# Patient Record
Sex: Male | Born: 1980
Health system: Southern US, Community
[De-identification: ages and names within clinical notes are randomized; demographics above are authoritative.]

## PROBLEM LIST (undated history)

## (undated) DIAGNOSIS — E7800 Pure hypercholesterolemia, unspecified: Secondary | ICD-10-CM

## (undated) DIAGNOSIS — I10 Essential (primary) hypertension: Secondary | ICD-10-CM

## (undated) DIAGNOSIS — G510 Bell's palsy: Secondary | ICD-10-CM

## (undated) DIAGNOSIS — M109 Gout, unspecified: Secondary | ICD-10-CM

## (undated) DIAGNOSIS — E119 Type 2 diabetes mellitus without complications: Secondary | ICD-10-CM

## (undated) HISTORY — PX: KNEE SURGERY: SHX244

---

## 2007-10-02 ENCOUNTER — Emergency Department (HOSPITAL_COMMUNITY): Admission: EM | Admit: 2007-10-02 | Discharge: 2007-10-02 | Payer: Self-pay | Admitting: Emergency Medicine

## 2008-09-10 ENCOUNTER — Emergency Department (HOSPITAL_COMMUNITY): Admission: EM | Admit: 2008-09-10 | Discharge: 2008-09-10 | Payer: Self-pay | Admitting: Emergency Medicine

## 2009-03-07 ENCOUNTER — Emergency Department (HOSPITAL_COMMUNITY): Admission: EM | Admit: 2009-03-07 | Discharge: 2009-03-07 | Payer: Self-pay | Admitting: Emergency Medicine

## 2009-12-05 ENCOUNTER — Emergency Department (HOSPITAL_COMMUNITY): Admission: EM | Admit: 2009-12-05 | Discharge: 2009-12-05 | Payer: Self-pay | Admitting: Emergency Medicine

## 2010-06-05 ENCOUNTER — Emergency Department (HOSPITAL_COMMUNITY)
Admission: EM | Admit: 2010-06-05 | Discharge: 2010-06-05 | Payer: Self-pay | Source: Home / Self Care | Admitting: Emergency Medicine

## 2010-08-31 LAB — COMPREHENSIVE METABOLIC PANEL
ALT: 31 U/L (ref 0–53)
AST: 24 U/L (ref 0–37)
Albumin: 4.3 g/dL (ref 3.5–5.2)
Alkaline Phosphatase: 62 U/L (ref 39–117)
BUN: 8 mg/dL (ref 6–23)
CO2: 26 meq/L (ref 19–32)
Calcium: 9.3 mg/dL (ref 8.4–10.5)
Chloride: 105 mEq/L (ref 96–112)
Creatinine, Ser: 1.07 mg/dL (ref 0.4–1.5)
GFR calc Af Amer: >60 mL/min (ref >60)
GFR calc non Af Amer: >60 mL/min (ref >60)
Glucose, Bld: 96 mg/dL (ref 70–99)
Potassium: 4 mEq/L (ref 3.5–5.1)
Sodium: 139 meq/L (ref 135–145)
Total Bilirubin: 0.8 mg/dL (ref 0.3–1.2)
Total Protein: 7.7 g/dL (ref 6.0–8.3)

## 2010-08-31 LAB — URINALYSIS, ROUTINE W REFLEX MICROSCOPIC
Bilirubin Urine: NEGATIVE
Glucose, UA: NEGATIVE mg/dL
Hgb urine dipstick: NEGATIVE
Ketones, ur: NEGATIVE mg/dL
Nitrite: NEGATIVE
Protein, ur: NEGATIVE mg/dL
Specific Gravity, Urine: 1.02 (ref 1.005–1.030)
Urobilinogen, UA: 0.2 mg/dL (ref 0.0–1.0)
pH: 6 (ref 5.0–8.0)

## 2010-08-31 LAB — CBC
HCT: 45.1 % (ref 39.0–52.0)
Hemoglobin: 15.6 g/dL (ref 13.0–17.0)
MCH: 31.1 pg (ref 26.0–34.0)
MCHC: 34.4 g/dL (ref 30.0–36.0)
MCV: 90.2 fL (ref 78.0–100.0)
Platelets: 373 10*3/uL (ref 150–400)
RBC: 5.01 MIL/uL (ref 4.22–5.81)
RDW: 13.8 % (ref 11.5–15.5)
WBC: 9.9 10*3/uL (ref 4.0–10.5)

## 2010-08-31 LAB — DIFFERENTIAL
Basophils Absolute: 0.1 10*3/uL (ref 0.0–0.1)
Basophils Relative: 1 % (ref 0–1)
Eosinophils Absolute: 0.3 10*3/uL (ref 0.0–0.7)
Eosinophils Relative: 3 % (ref 0–5)
Lymphocytes Relative: 27 % (ref 12–46)
Lymphs Abs: 2.7 10*3/uL (ref 0.7–4.0)
Monocytes Absolute: 0.6 10*3/uL (ref 0.1–1.0)
Monocytes Relative: 6 % (ref 3–12)
Neutro Abs: 6.3 10*3/uL (ref 1.7–7.7)
Neutrophils Relative %: 63 % (ref 43–77)

## 2010-08-31 LAB — LIPASE, BLOOD: Lipase: 27 U/L (ref 11–59)

## 2010-10-26 IMAGING — CR DG FOOT COMPLETE 3+V*L*
3 series · 3 of 3 positions shown · non-contrast
Comparison: None

CLINICAL DATA: Recent injury.  Something fell on foot.

LEFT FOOT - COMPLETE 3+ VIEW

[view not recorded (1 of 3)]
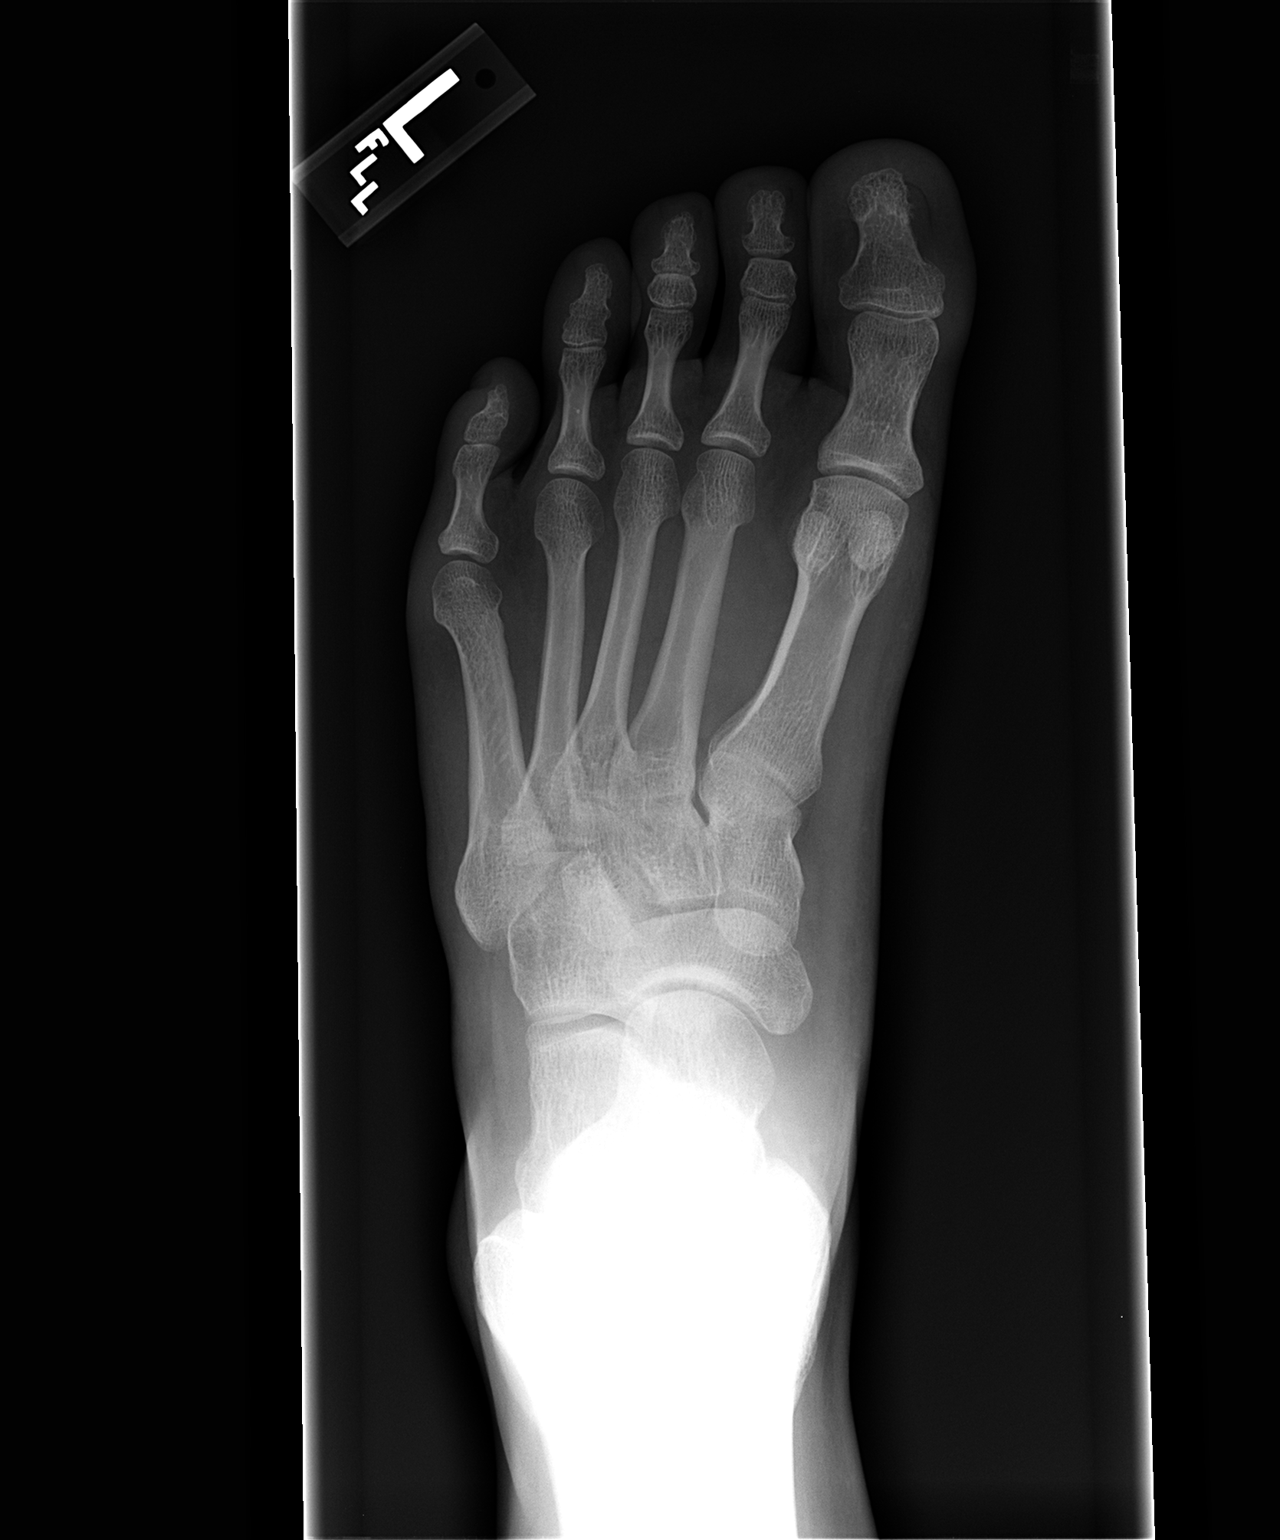

[view not recorded (2 of 3)]
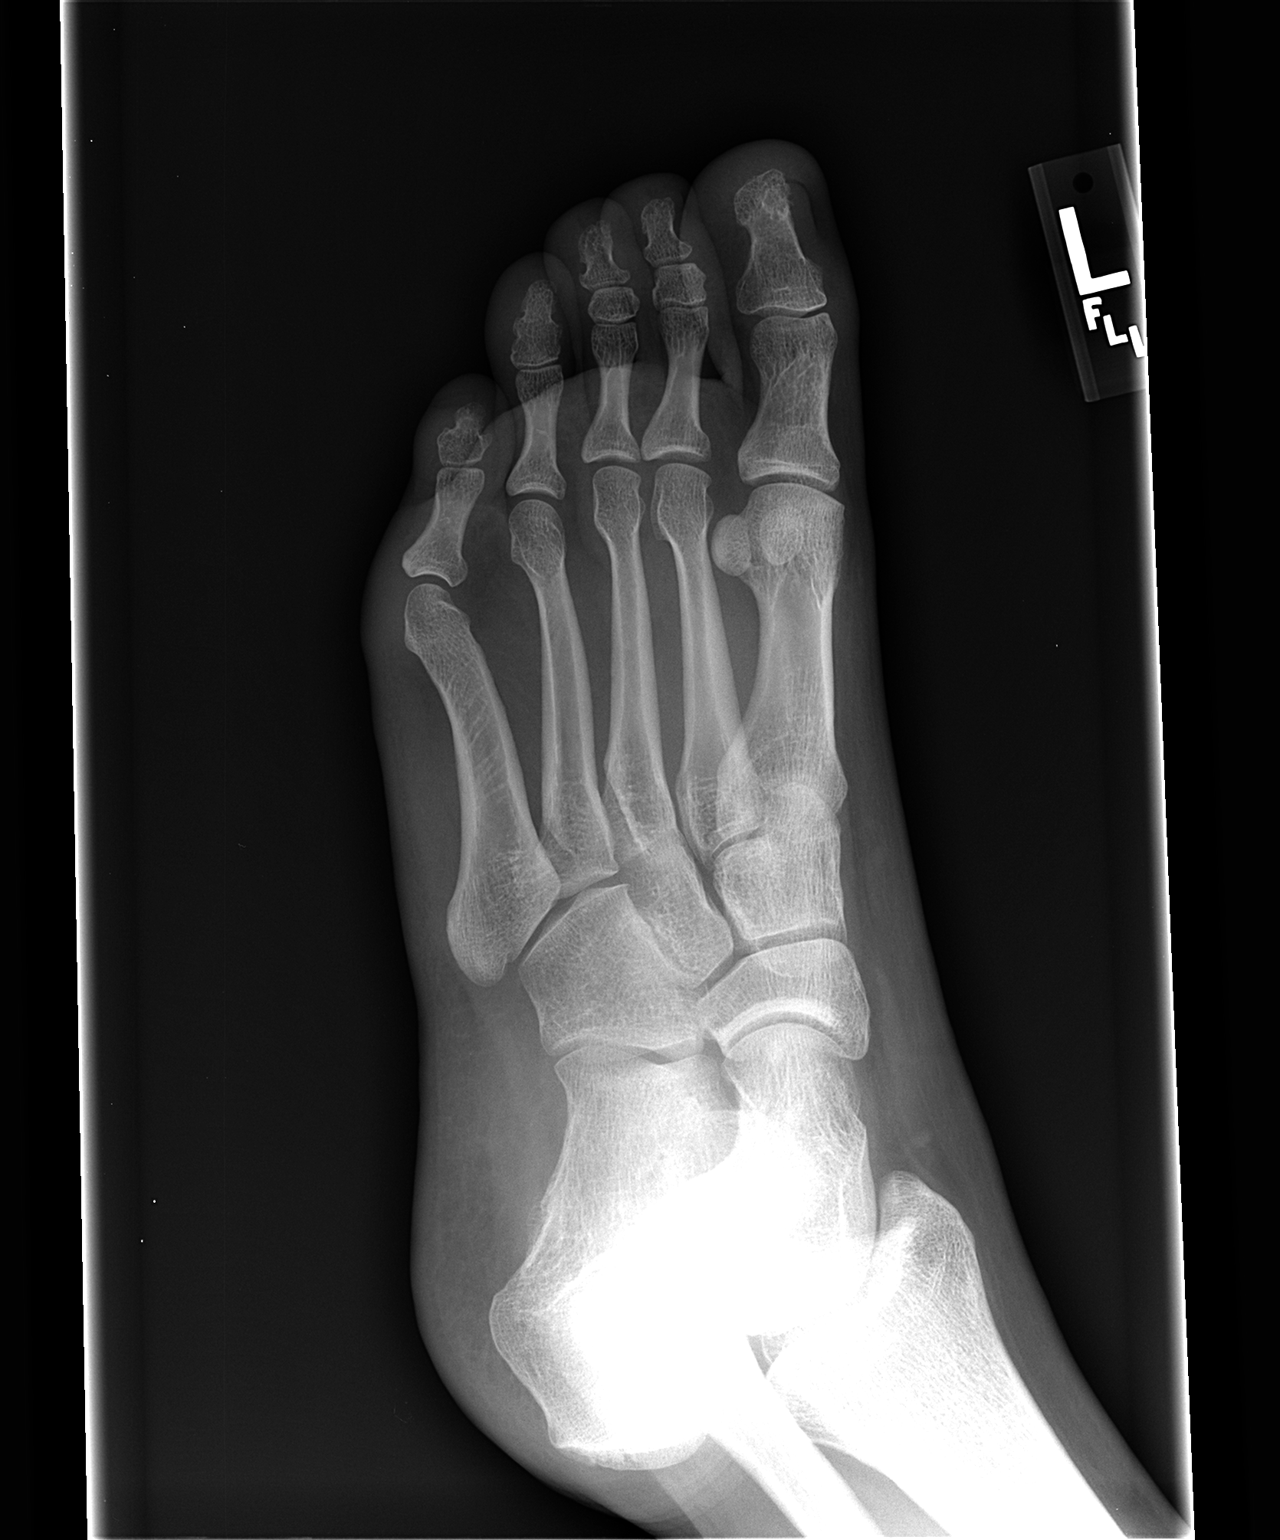

[view not recorded (3 of 3)]
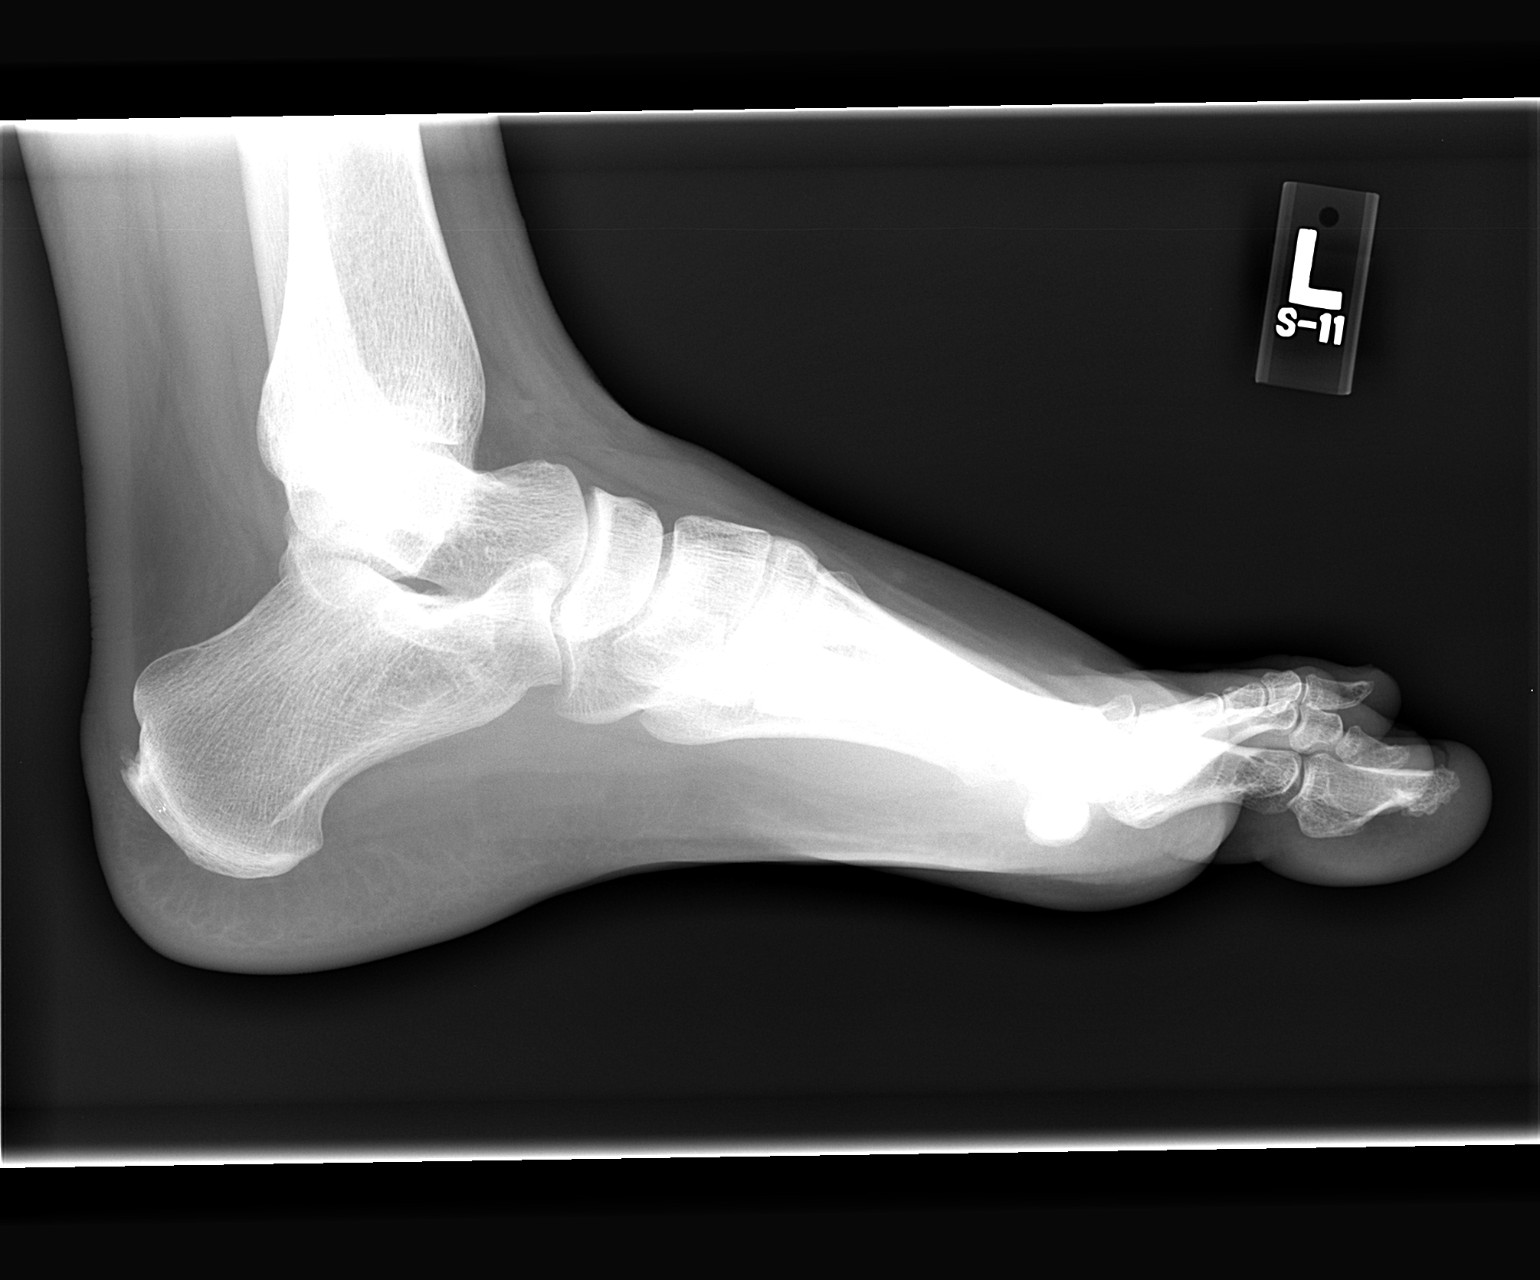

[3 of 3 positions shown; findings below may reference images not displayed]

FINDINGS: No fracture or subluxation.
IMPRESSION: Negative left foot.

## 2011-03-10 LAB — BASIC METABOLIC PANEL
BUN: 11
CO2: 26
Calcium: 9.4
Chloride: 106
Creatinine, Ser: 1.1
GFR calc Af Amer: >60
GFR calc non Af Amer: >60
Glucose, Bld: 116 — ABNORMAL HIGH
Potassium: 4.1
Sodium: 139

## 2011-03-10 LAB — POCT CARDIAC MARKERS
CKMB, poc: <1 — ABNORMAL LOW
Myoglobin, poc: 53.3
Operator id: 216221
Troponin i, poc: <0.05

## 2011-03-10 LAB — CBC
HCT: 42.2
Hemoglobin: 14.7
MCHC: 34.8
MCV: 88.2
Platelets: 377
RBC: 4.78
RDW: 13.4
WBC: 10.6 — ABNORMAL HIGH

## 2011-03-10 LAB — DIFFERENTIAL
Basophils Absolute: 0.1
Basophils Relative: 1
Eosinophils Absolute: 0.6
Eosinophils Relative: 5
Lymphocytes Relative: 25
Lymphs Abs: 2.7
Monocytes Absolute: 0.9
Monocytes Relative: 9
Neutro Abs: 6.4
Neutrophils Relative %: 60

## 2015-09-05 DIAGNOSIS — L259 Unspecified contact dermatitis, unspecified cause: Secondary | ICD-10-CM | POA: Diagnosis not present

## 2015-11-23 ENCOUNTER — Emergency Department (HOSPITAL_COMMUNITY): Payer: 59

## 2015-11-23 ENCOUNTER — Encounter (HOSPITAL_COMMUNITY): Payer: Self-pay

## 2015-11-23 ENCOUNTER — Emergency Department (HOSPITAL_COMMUNITY)
Admission: EM | Admit: 2015-11-23 | Discharge: 2015-11-23 | Disposition: A | Discharge status: Home / Self Care | Payer: 59 | Attending: Emergency Medicine | Admitting: Emergency Medicine

## 2015-11-23 DIAGNOSIS — S99912A Unspecified injury of left ankle, initial encounter: Secondary | ICD-10-CM | POA: Diagnosis present

## 2015-11-23 DIAGNOSIS — Y990 Civilian activity done for income or pay: Secondary | ICD-10-CM | POA: Diagnosis not present

## 2015-11-23 DIAGNOSIS — Y9389 Activity, other specified: Secondary | ICD-10-CM | POA: Diagnosis not present

## 2015-11-23 DIAGNOSIS — M25462 Effusion, left knee: Secondary | ICD-10-CM | POA: Insufficient documentation

## 2015-11-23 DIAGNOSIS — Z79899 Other long term (current) drug therapy: Secondary | ICD-10-CM | POA: Diagnosis not present

## 2015-11-23 DIAGNOSIS — M25572 Pain in left ankle and joints of left foot: Secondary | ICD-10-CM | POA: Diagnosis not present

## 2015-11-23 DIAGNOSIS — W11XXXA Fall on and from ladder, initial encounter: Secondary | ICD-10-CM | POA: Diagnosis not present

## 2015-11-23 DIAGNOSIS — S93402A Sprain of unspecified ligament of left ankle, initial encounter: Secondary | ICD-10-CM | POA: Diagnosis not present

## 2015-11-23 DIAGNOSIS — Y929 Unspecified place or not applicable: Secondary | ICD-10-CM | POA: Insufficient documentation

## 2015-11-23 DIAGNOSIS — G510 Bell's palsy: Secondary | ICD-10-CM | POA: Diagnosis not present

## 2015-11-23 DIAGNOSIS — W19XXXA Unspecified fall, initial encounter: Secondary | ICD-10-CM

## 2015-11-23 HISTORY — DX: Gout, unspecified: M10.9

## 2015-11-23 HISTORY — DX: Bell's palsy: G51.0

## 2015-11-23 MED ORDER — HYDROCODONE-ACETAMINOPHEN 5-325 MG PO TABS: 1.0000 | ORAL_TABLET | ORAL | Status: DC | PRN

## 2015-11-23 MED ORDER — MORPHINE SULFATE (PF) 4 MG/ML IV SOLN
8.0000 mg | Freq: Once | INTRAVENOUS | Status: AC
  Administered 2015-11-23: 8 mg via INTRAMUSCULAR
  Filled 2015-11-23: qty 2

## 2015-11-23 MED ORDER — ONDANSETRON HCL 4 MG PO TABS
4.0000 mg | ORAL_TABLET | Freq: Once | ORAL | Status: AC
  Administered 2015-11-23: 4 mg via ORAL
  Filled 2015-11-23: qty 1

## 2015-11-23 MED ORDER — DICLOFENAC SODIUM 75 MG PO TBEC: 75.0000 mg | DELAYED_RELEASE_TABLET | Freq: Two times a day (BID) | ORAL | Status: DC

## 2015-11-23 NOTE — ED Notes (Signed)
Pt fell from ladder while putting up a garage door opener. He has no complaints other than left knee pain

## 2015-11-23 NOTE — ED Provider Notes (Signed)
CSN: [id number]     Arrival date & time 11/23/15  2046 History   First MD Initiated Contact with Patient 11/23/15 2204     Chief Complaint  Patient presents with  . Leg Injury  . Fall     (Consider location/radiation/quality/duration/timing/severity/associated sxs/prior Treatment) HPI Comments: Patient is a 35 year old male who presents to the emergency department with left lower extremity injury following a fall.  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 The door accidentally began to move, it dislodged his ladder, and he fell. He thinks he may have been approximately 5 or 6 feet from the ground. He denies loss of consciousness. He denies neck pain. He denies hip pain. He complains of pain of the left knee, and the left ankle. He had surgery on the left knee during childhood, but no other procedures on the left knee or the left ankle. He took pain medication before leaving home, but states he still has a great deal of pain.  Patient is a 35 y.o. male presenting with fall. The history is provided by the patient.  Fall This is a new problem. Associated symptoms include arthralgias.    Past Medical History  Diagnosis Date  . Gout   . Bell's palsy    Past Surgical History  Procedure Laterality Date  . Knee surgery     No family history on file. Social History  Substance Use Topics  . Smoking status: Never Smoker   . Smokeless tobacco: None  . Alcohol Use: No    Review of Systems  Musculoskeletal: Positive for arthralgias.  All other systems reviewed and are negative.     Allergies  Review of patient's allergies indicates no known allergies.  Home Medications   Prior to Admission medications   Medication Sig Start Date End Date Taking? Authorizing Provider  levocetirizine (XYZAL) 5 MG tablet Take 5 mg by mouth every evening.   Yes Historical Provider, MD  PREVIDENT 5000 BOOSTER PLUS 1.1 % PSTE  10/25/15    Historical Provider, MD   BP 149/97 mmHg  Pulse 96  Temp(Src) 99.3 F (37.4 C) (Oral)  Resp 18  Ht 5\' 11"  (1.803 m)  Wt 122.471 kg  BMI 37.67 kg/m2  SpO2 100% Physical Exam  Constitutional: He is oriented to person, place, and time. He appears well-developed and well-nourished.  Non-toxic appearance.  HENT:  Head: Normocephalic.  Right Ear: Tympanic membrane and external ear normal.  Left Ear: Tympanic membrane and external ear normal.  Eyes: EOM and lids are normal. Pupils are equal, round, and reactive to light.  Neck: Normal range of motion. Neck supple. Carotid bruit is not present.  Cardiovascular: Normal rate, regular rhythm, normal heart sounds, intact distal pulses and normal pulses.   Pulmonary/Chest: Breath sounds normal. No respiratory distress.  There is no chest wall bruising appreciated. There is no rib area tenderness. There is symmetrical rise and fall of the chest.  Abdominal: Soft. Bowel sounds are normal. There is no tenderness. There is no guarding.  Musculoskeletal: Normal range of motion.  There is no palpable step off of the cervical, thoracic, or lumbar spine. There is good range of motion of right and left hip. There is effusion noted of the left knee. There is no deformity of the quadricep area or the patella tendon. There is no posterior mass appreciated. The Achilles tendon on the left is intact. There is swelling at the lateral malleolus.  There is full range of motion of the toes. The dorsalis pedis pulse is 2+ on the left and the right.  Lymphadenopathy:       Head (right side): No submandibular adenopathy present.       Head (left side): No submandibular adenopathy present.    He has no cervical adenopathy.  Neurological: He is alert and oriented to person, place, and time. He has normal strength. No cranial nerve deficit or sensory deficit.  Skin: Skin is warm and dry.  Psychiatric: He has a normal mood and affect. His speech is normal.  Nursing note  and vitals reviewed.   ED Course  Procedures (including critical care time) Labs Review Labs Reviewed - No data to display  Imaging Review Dg Ankle Complete Left  11/23/2015  CLINICAL DATA:  Status post fall from ladder, with left ankle pain. Initial encounter. EXAM: LEFT ANKLE COMPLETE - 3+ VIEW COMPARISON:  Left foot radiographs performed 03/07/2009 FINDINGS: There is no evidence of fracture or dislocation. The ankle mortise is intact; the interosseous space is within normal limits. No talar tilt or subluxation is seen. A posterior calcaneal spur is noted. The joint spaces are preserved. No significant soft tissue abnormalities are seen. IMPRESSION: No evidence of fracture or dislocation. Electronically Signed   By: Roanna Raider M.D.   On: 11/23/2015 22:09   Dg Knee Complete 4 Views Left  11/23/2015  CLINICAL DATA:  General left knee pain,radiating down to left ankle. Larey Seat off of a ladder unto left leg. EXAM: LEFT KNEE - COMPLETE 4+ VIEW COMPARISON:  None. FINDINGS: Mild arthritis.  No fracture.  Moderate joint effusion. IMPRESSION: Joint effusion Electronically Signed   By: Esperanza Heir M.D.   On: 11/23/2015 21:32   I have personally reviewed and evaluated these images and lab results as part of my medical decision-making.   EKG Interpretation None      MDM  X-ray of the left ankle shows no evidence of fracture or dislocation. X-ray of the left knee shows a joint effusion present, but no fracture or dislocation.  The patient will be treated with a knee immobilizer and ankle stirrup splint. He'll be fitted with crutches. Prescription for Norco and ibuprofen will be given to the patient. He will follow-up with Dr. Romeo Apple for orthopedic evaluation next week.    Final diagnoses:  Fall    **I have reviewed nursing notes, vital signs, and all appropriate lab and imaging results for this patient.Ivery Quale, PA-C 11/23/15 2243  Donnetta Hutching, MD 11/25/15 [phone]

## 2015-11-23 NOTE — Discharge Instructions (Signed)
The x-ray of your knee and ankle are negative for fracture or dislocation. There is noted fluid in the joint on your knee. This also noted some arthritis changes of the knee. You're being fitted with a knee immobilizer and an ankle stirrup splint. You do not need to sleep in these devices, but should have them on when up and about. Please use crutches until you can safely apply weight to your left lower extremity. Please see Dr. Romeo AppleHarrison for orthopedic evaluation next week, especially if pain is increasing. May use ibuprofen 3 times daily with food. Use Norco for more severe pain. This medication may cause drowsiness. Please do not drink, drive, or participate in activity that requires concentration while taking this medication. Knee Effusion Knee effusion means that you have excess fluid in your knee joint. This can cause pain and swelling in your knee. This may make your knee more difficult to bend and move. That is because there is increased pain and pressure in the joint. If there is fluid in your knee, it often means that something is wrong inside your knee, such as severe arthritis, abnormal inflammation, or an infection. Another common cause of knee effusion is an injury to the knee muscles, ligaments, or cartilage. HOME CARE INSTRUCTIONS  Use crutches as directed by your health care provider.  Wear a knee brace as directed by your health care provider.  Apply ice to the swollen area:  Put ice in a plastic bag.  Place a towel between your skin and the bag.  Leave the ice on for 20 minutes, 2-3 times per day.  Keep your knee raised (elevated) when you are sitting or lying down.  Take medicines only as directed by your health care provider.  Do any rehabilitation or strengthening exercises as directed by your health care provider.  Rest your knee as directed by your health care provider. You may start doing your normal activities again when your health care provider approves.   Keep  all follow-up visits as directed by your health care provider. This is important. SEEK MEDICAL CARE IF:  You have ongoing (persistent) pain in your knee. SEEK IMMEDIATE MEDICAL CARE IF:  You have increased swelling or redness of your knee.  You have severe pain in your knee.  You have a fever.   This information is not intended to replace advice given to you by your health care provider. Make sure you discuss any questions you have with your health care provider.   Document Released: 08/22/2003 Document Revised: 06/22/2014 Document Reviewed: 01/15/2014 Elsevier Interactive Patient Education [employer]2016 Elsevier Inc.

## 2015-11-23 NOTE — ED Notes (Signed)
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  Complaining of pain in left leg from left knee down.

## 2015-11-26 DIAGNOSIS — H5213 Myopia, bilateral: Secondary | ICD-10-CM | POA: Diagnosis not present

## 2015-11-28 ENCOUNTER — Ambulatory Visit (INDEPENDENT_AMBULATORY_CARE_PROVIDER_SITE_OTHER): Payer: 59 | Admitting: Orthopaedic Surgery

## 2015-11-28 ENCOUNTER — Encounter: Payer: Self-pay | Admitting: Orthopaedic Surgery

## 2015-11-28 VITALS — BP 129/87 | HR 71 | Temp 97.7°F | Ht 70.0 in | Wt 271.0 lb

## 2015-11-28 DIAGNOSIS — M25562 Pain in left knee: Secondary | ICD-10-CM | POA: Diagnosis not present

## 2015-11-28 NOTE — Patient Instructions (Signed)
May return to work  

## 2015-11-28 NOTE — Progress Notes (Signed)
Subjective:  I hurt my left knee Saturday    Patient ID: Gavin Wilson, male    DOB: 11/02/1980, [age] y.o.   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  This past Saturday, June 10, he was working with a ladder.  There was a malfunction and he fell from the ladder and landed on his right side but also hurt his left knee.  He was seen in the ER.  I have reviewed the records, the x-rays and the x-ray report.  He had no fracture but had effusion of the left knee.  He was given a knee immobilizer and crutches.  He is much improved with much less swelling of the left knee and much less pain.  He has no other areas of tenderness today.   Review of Systems  HENT: Negative for congestion.   Respiratory: Negative for cough and shortness of breath.   Cardiovascular: Negative for chest pain and leg swelling.  Endocrine: Negative for cold intolerance.  Musculoskeletal: Positive for joint swelling, arthralgias and gait problem.  Allergic/Immunologic: Negative for environmental allergies.   Past Medical History  Diagnosis Date  . Gout   . Bell's palsy     Past Surgical History  Procedure Laterality Date  . Knee surgery      Current Outpatient Prescriptions on File Prior to Visit  Medication Sig Dispense Refill  . diclofenac (VOLTAREN) 75 MG EC tablet Take 1 tablet (75 mg total) by mouth 2 (two) times daily. 14 tablet 0  . HYDROcodone-acetaminophen (NORCO/VICODIN) 5-325 MG tablet Take 1 tablet by mouth every 4 (four) hours as needed. 15 tablet 0  . levocetirizine (XYZAL) 5 MG tablet Take 5 mg by mouth every evening.    Marland Kitchen. PREVIDENT 5000 BOOSTER PLUS 1.1 % PSTE   2   No current facility-administered medications on file prior to visit.    Social History   Social History  . Marital Status: Married    Spouse Name: N/A  . Number of Children: N/A  . Years of Education: N/A   Occupational History  . Not on file.   Social History Main Topics  .  Smoking status: Never Smoker   . Smokeless tobacco: Not on file  . Alcohol Use: No  . Drug Use: No  . Sexual Activity: Not on file   Other Topics Concern  . Not on file   Social History Narrative    BP 129/87 mmHg  Pulse 71  Temp(Src) 97.7 F (36.5 C)  Ht 5\' 10"  (1.778 m)  Wt 271 lb (122.925 kg)  BMI 38.88 kg/m2      Objective:   Physical Exam  Constitutional: He is oriented to person, place, and time. He appears well-developed and well-nourished.  HENT:  Head: Normocephalic and atraumatic.  Eyes: Conjunctivae and EOM are normal. Pupils are equal, round, and reactive to light.  Neck: Normal range of motion. Neck supple.  Cardiovascular: Normal rate, regular rhythm and intact distal pulses.   Pulmonary/Chest: Effort normal.  Abdominal: Soft.  Musculoskeletal: He exhibits tenderness (Left knee has slight effusion, ROM 0 to 110, stable, no redness, NV intact. Limp to left.  Right knee negative.).  Neurological: He is alert and oriented to person, place, and time. He has normal reflexes. No cranial nerve deficit. He exhibits normal muscle tone. Coordination normal.  Skin: Skin is warm and dry.  Psychiatric: He has a normal mood and affect. His behavior is normal. Judgment and thought  content normal.          Assessment & Plan:   Encounter Diagnosis  Name Primary?  . Left knee pain Yes   I have told him he can stop the knee immobilizer.  He should use the crutches and go to one crutch on the right when he has less pain.  He is to weight bear as tolerated.  Use ice as needed.  Return in one week.  He can return to work.  Call if any problem.  No ladder climbing.  Precautions discussed.  Electronically Signed Darreld Mclean, MD 6/15/20179:13 AM

## 2015-12-05 ENCOUNTER — Ambulatory Visit (INDEPENDENT_AMBULATORY_CARE_PROVIDER_SITE_OTHER): Payer: 59 | Admitting: Orthopaedic Surgery

## 2015-12-05 ENCOUNTER — Encounter: Payer: Self-pay | Admitting: Orthopaedic Surgery

## 2015-12-05 VITALS — BP 147/95 | HR 77 | Temp 97.5°F | Ht 72.0 in | Wt 270.0 lb

## 2015-12-05 DIAGNOSIS — M25562 Pain in left knee: Secondary | ICD-10-CM | POA: Diagnosis not present

## 2015-12-05 NOTE — Progress Notes (Signed)
Patient XB:JYNWGN:Gavin Wilson, male DOB:12/21/1980, 35 y.o. FAO:[id number]  Chief Complaint  Patient presents with  . Follow-up    Left knee    HPI  Gavin Wilson is a 35 y.o. 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  He is much improved.  He has less pain and less swelling.  He has no giving way.  He is using one crutch now.  He has no redness or numbness.  He fells much better.  HPI  Body mass index is 36.61 kg/(m^2).  ROS  Review of Systems  HENT: Negative for congestion.   Respiratory: Negative for cough and shortness of breath.   Cardiovascular: Negative for chest pain and leg swelling.  Endocrine: Negative for cold intolerance.  Musculoskeletal: Positive for joint swelling, arthralgias and gait problem.  Allergic/Immunologic: Negative for environmental allergies.    Past Medical History  Diagnosis Date  . Gout   . Bell's palsy     Past Surgical History  Procedure Laterality Date  . Knee surgery      History reviewed. No pertinent family history.  Social History Social History  Substance Use Topics  . Smoking status: Never Smoker   . Smokeless tobacco: None  . Alcohol Use: No    No Known Allergies  Current Outpatient Prescriptions  Medication Sig Dispense Refill  . diclofenac (VOLTAREN) 75 MG EC tablet Take 1 tablet (75 mg total) by mouth 2 (two) times daily. 14 tablet 0  . HYDROcodone-acetaminophen (NORCO/VICODIN) 5-325 MG tablet Take 1 tablet by mouth every 4 (four) hours as needed. 15 tablet 0  . levocetirizine (XYZAL) 5 MG tablet Take 5 mg by mouth every evening.    Marland Kitchen. PREVIDENT 5000 BOOSTER PLUS 1.1 % PSTE   2   No current facility-administered medications for this visit.     Physical Exam  Blood pressure 147/95, pulse 77, temperature 97.5 F (36.4 C), height 6' (1.829 m), weight 270 lb (122.471 kg).  Constitutional: overall normal hygiene, normal nutrition, well developed, normal grooming, normal body  habitus. Assistive device:one crutch  Musculoskeletal: gait and station Limp left, muscle tone and strength are normal, no tremors or atrophy is present.  .  Neurological: coordination overall normal.  Deep tendon reflex/nerve stretch intact.  Sensation normal.  Cranial nerves II-XII intact.   Skin:   normal overall no scars, lesions, ulcers or rashes. No psoriasis.  Psychiatric: Alert and oriented x 3.  Recent memory intact, remote memory unclear.  Normal mood and affect. Well groomed.  Good eye contact.  Cardiovascular: overall no swelling, no varicosities, no edema bilaterally, normal temperatures of the legs and arms, no clubbing, cyanosis and good capillary refill.  Lymphatic: palpation is normal.  The left lower extremity is examined:  Inspection:  Thigh:  Non-tender and no defects  Knee has swelling 1+ effusion.                        Joint tenderness is not present                        Patient is not tender over the medial joint line  Lower Leg:  Has normal appearance and no tenderness or defects  Ankle:  Non-tender and no defects  Foot:  Non-tender and no defects Range of Motion:  Knee:  Range of motion is: 0-115  Crepitus is  present  Ankle:  Range of motion is normal. Strength and Tone:  The left lower extremity has normal strength and tone. Stability:  Knee:  The knee is stable.  Ankle:  The ankle is stable.    The patient has been educated about the nature of the problem(s) and counseled on treatment options.  The patient appeared to understand what I have discussed and is in agreement with it.  Encounter Diagnosis  Name Primary?  . Left knee pain Yes    PLAN Call if any problems.  Precautions discussed.  Continue current medications.   Return to clinic 3 weeks   Electronically Signed Darreld McleanWayne Albaro Deviney, MD 6/22/201710:18 AM

## 2015-12-31 ENCOUNTER — Ambulatory Visit: Payer: 59 | Admitting: Orthopaedic Surgery

## 2017-04-15 DIAGNOSIS — L600 Ingrowing nail: Secondary | ICD-10-CM | POA: Diagnosis not present

## 2017-05-03 DIAGNOSIS — L600 Ingrowing nail: Secondary | ICD-10-CM | POA: Diagnosis not present

## 2017-05-03 DIAGNOSIS — L03031 Cellulitis of right toe: Secondary | ICD-10-CM | POA: Diagnosis not present

## 2017-05-17 DIAGNOSIS — L600 Ingrowing nail: Secondary | ICD-10-CM | POA: Diagnosis not present

## 2017-05-17 DIAGNOSIS — L03031 Cellulitis of right toe: Secondary | ICD-10-CM | POA: Diagnosis not present

## 2017-05-17 DIAGNOSIS — M79674 Pain in right toe(s): Secondary | ICD-10-CM | POA: Diagnosis not present

## 2017-05-28 DIAGNOSIS — R5383 Other fatigue: Secondary | ICD-10-CM | POA: Diagnosis not present

## 2017-06-09 DIAGNOSIS — R5383 Other fatigue: Secondary | ICD-10-CM | POA: Diagnosis not present

## 2017-06-09 DIAGNOSIS — J330 Polyp of nasal cavity: Secondary | ICD-10-CM | POA: Diagnosis not present

## 2017-06-09 DIAGNOSIS — G4726 Circadian rhythm sleep disorder, shift work type: Secondary | ICD-10-CM | POA: Diagnosis not present

## 2017-06-23 DIAGNOSIS — J343 Hypertrophy of nasal turbinates: Secondary | ICD-10-CM | POA: Diagnosis not present

## 2017-06-23 DIAGNOSIS — J310 Chronic rhinitis: Secondary | ICD-10-CM | POA: Diagnosis not present

## 2017-06-23 DIAGNOSIS — J351 Hypertrophy of tonsils: Secondary | ICD-10-CM | POA: Diagnosis not present

## 2017-06-23 DIAGNOSIS — G4733 Obstructive sleep apnea (adult) (pediatric): Secondary | ICD-10-CM | POA: Diagnosis not present

## 2017-06-25 DIAGNOSIS — R7301 Impaired fasting glucose: Secondary | ICD-10-CM | POA: Diagnosis not present

## 2017-06-25 DIAGNOSIS — Z0000 Encounter for general adult medical examination without abnormal findings: Secondary | ICD-10-CM | POA: Diagnosis not present

## 2017-06-29 ENCOUNTER — Encounter: Payer: Self-pay | Admitting: Pulmonary Disease

## 2017-06-29 ENCOUNTER — Ambulatory Visit: Payer: BLUE CROSS/BLUE SHIELD | Admitting: Pulmonary Disease

## 2017-06-29 VITALS — BP 126/82 | HR 72 | Ht 72.0 in | Wt 278.4 lb

## 2017-06-29 DIAGNOSIS — E669 Obesity, unspecified: Secondary | ICD-10-CM | POA: Diagnosis not present

## 2017-06-29 DIAGNOSIS — G4733 Obstructive sleep apnea (adult) (pediatric): Secondary | ICD-10-CM | POA: Diagnosis not present

## 2017-06-29 NOTE — Assessment & Plan Note (Signed)
Hopefully once he starts CPAP therapy,he will Have more energy and interest in weight loss

## 2017-06-29 NOTE — Progress Notes (Signed)
Subjective:    Patient ID: Gavin Wilson, male    DOB: 30-Nov-1980, 37 y.o.   MRN: [id number]  HPI  Chief Complaint  Patient presents with  . Sleep Consult    Referred by Dr. Suszanne Conners for possible OSA. Per patient's wife, he does stop breathing at night. Patient states he does wake up tired in the mornings. Denies ever having a sleep study before.     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 He reports loud snoring for many years and choking and gagging episodes that come up from sleep.  Describes long pauses in his sleep between snoring.  He reports being a mouth breather for many years.  She reports restless sleep.  He was evaluated by ENT and told that his nasal passages were normal.  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  Epworth sleepiness score is 14 and he reports sleepiness in various situations such as watching TV, sitting and reading, lying down rest in the afternoons. On his workdays bedtime can be up to 8:30 AM, sleep latency is about 30 minutes, he sleeps on his side with 3 pillows but often slides down during his sleep.  He reports 3-4 awakenings including nocturia and is out of bed anywhere between 3:58 PM with dryness of mouth and occasional headaches.  He has gained about 50 pounds over the last 10 years  There is no history suggestive of cataplexy, sleep paralysis or parasomnias     Past Medical History:  Diagnosis Date  . Bell's palsy   . Gout    Past Surgical History:  Procedure Laterality Date  . KNEE SURGERY       No Known Allergies   Social History   Socioeconomic History  . Marital status: Married    Spouse name: Not on file  . Number of children: Not on file  . Years of education: Not on file  . Highest education level: Not on file   Social Needs  . Financial resource strain: Not on file  . Food insecurity - worry: Not on file  . Food insecurity - inability: Not on file  . Transportation needs - medical: Not on file  . Transportation needs - non-medical: Not on file  Occupational History  . Not on file  Tobacco Use  . Smoking status: Never Smoker  . Smokeless tobacco: Never Used  Substance and Sexual Activity  . Alcohol use: No  . Drug use: No  . Sexual activity: Not on file  Other Topics Concern  . Not on file  Social History Narrative  . Not on file     No family history on file.   Review of Systems  Positive for nasal congestion, anxiety, joint stiffness due to gout  Constitutional: negative for anorexia, fevers and sweats  Eyes: negative for irritation, redness and visual disturbance  Ears, nose, mouth, throat, and face: negative for earaches, epistaxis, nasal congestion and sore throat  Respiratory: negative for cough, dyspnea on exertion, sputum and wheezing  Cardiovascular: negative for chest pain, dyspnea, lower extremity edema, orthopnea, palpitations and syncope  Gastrointestinal: negative for abdominal pain, constipation, diarrhea, melena, nausea and vomiting  Genitourinary:negative for dysuria, frequency and hematuria  Hematologic/lymphatic: negative for bleeding, easy bruising and lymphadenopathy  Musculoskeletal:negative for  arthralgias, muscle weakness and stiff joints  Neurological: negative for coordination problems, gait problems, headaches and weakness  Endocrine: negative for diabetic symptoms including polydipsia, polyuria and weight loss     Objective:   Physical Exam  Gen. Pleasant, obese, in no distress, normal affect ENT - large tonsils, no post nasal drip, class 3 airway Neck: No JVD, no thyromegaly, no carotid bruits Lungs: no use of accessory muscles, no dullness to percussion, decreased without rales or rhonchi  Cardiovascular: Rhythm regular, heart sounds  normal, no  murmurs or gallops, no peripheral edema Abdomen: soft and non-tender, no hepatosplenomegaly, BS normal. Musculoskeletal: No deformities, no cyanosis or clubbing Neuro:  alert, non focal, no tremors       Assessment & Plan:

## 2017-06-29 NOTE — Assessment & Plan Note (Signed)
Given excessive daytime somnolence, narrow pharyngeal exam, witnessed apneas & loud snoring, obstructive sleep apnea is very likely & an overnight polysomnogram will be scheduled as a home study. The pathophysiology of obstructive sleep apnea , it's cardiovascular consequences & modes of treatment including CPAP were discused with the patient in detail & they evidenced understanding.  Pretest probability is high He does have an underbite but his stability is high and he will likely still need CPAP therapy.  Could consider a combination of oral appliance especially pressure needs are high

## 2017-06-29 NOTE — Patient Instructions (Signed)
Home sleep study 

## 2017-06-30 DIAGNOSIS — Z0001 Encounter for general adult medical examination with abnormal findings: Secondary | ICD-10-CM | POA: Diagnosis not present

## 2017-06-30 DIAGNOSIS — Z23 Encounter for immunization: Secondary | ICD-10-CM | POA: Diagnosis not present

## 2017-07-13 IMAGING — DX DG KNEE COMPLETE 4+V*L*
4 series · 4 of 4 positions shown · non-contrast
Comparison: None.

CLINICAL DATA: General left knee pain,radiating down to left ankle.
Fell off of a ladder unto left leg.

EXAM:
LEFT KNEE - COMPLETE 4+ VIEW

[knee ap (1 of 3)]
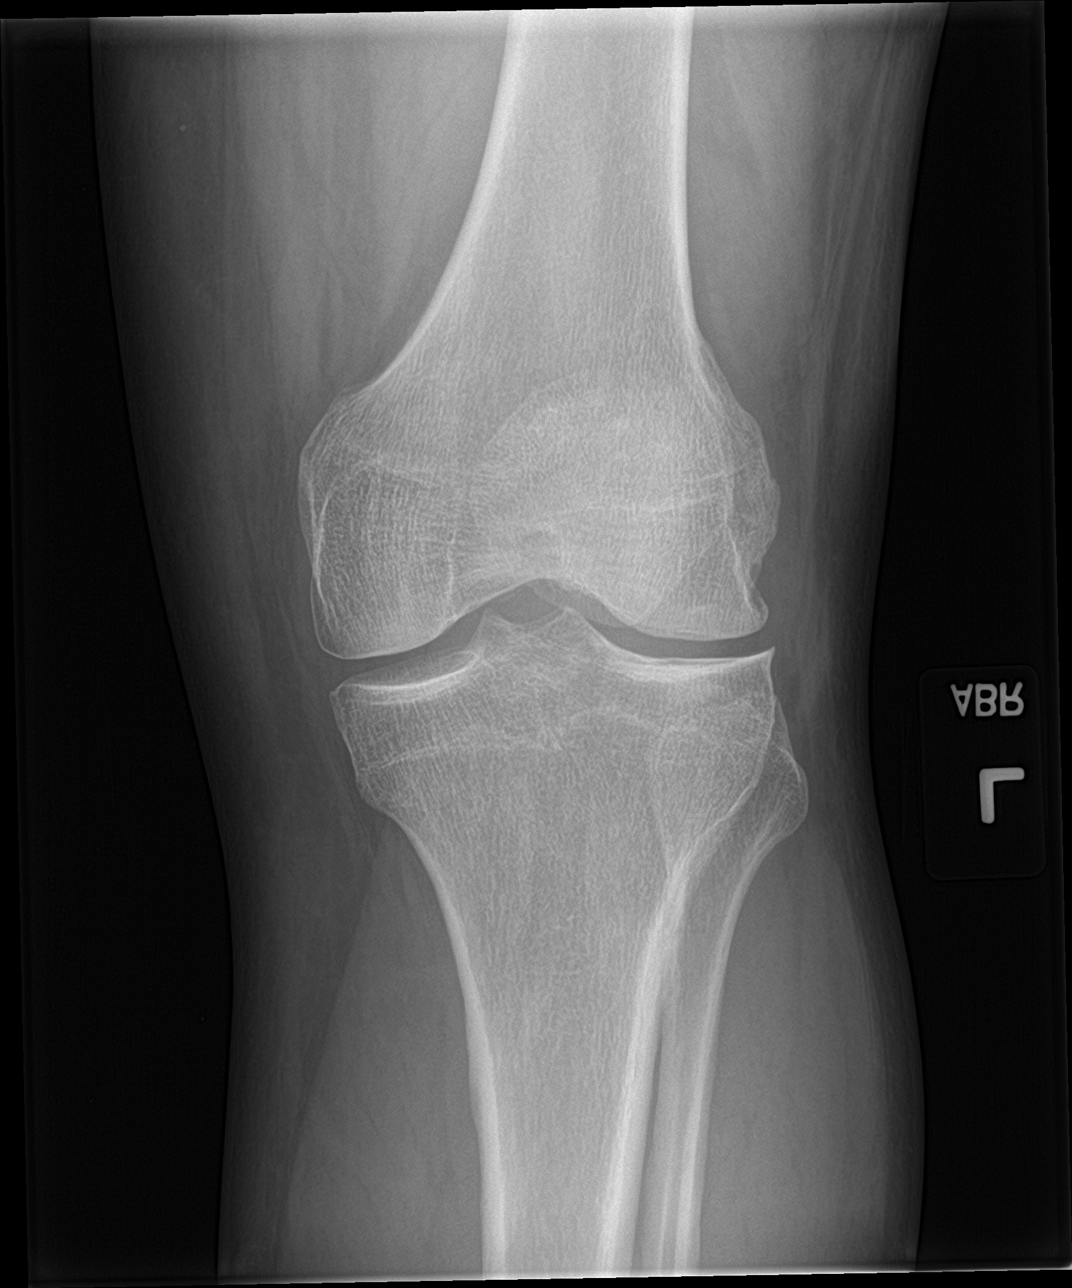

[knee lat]
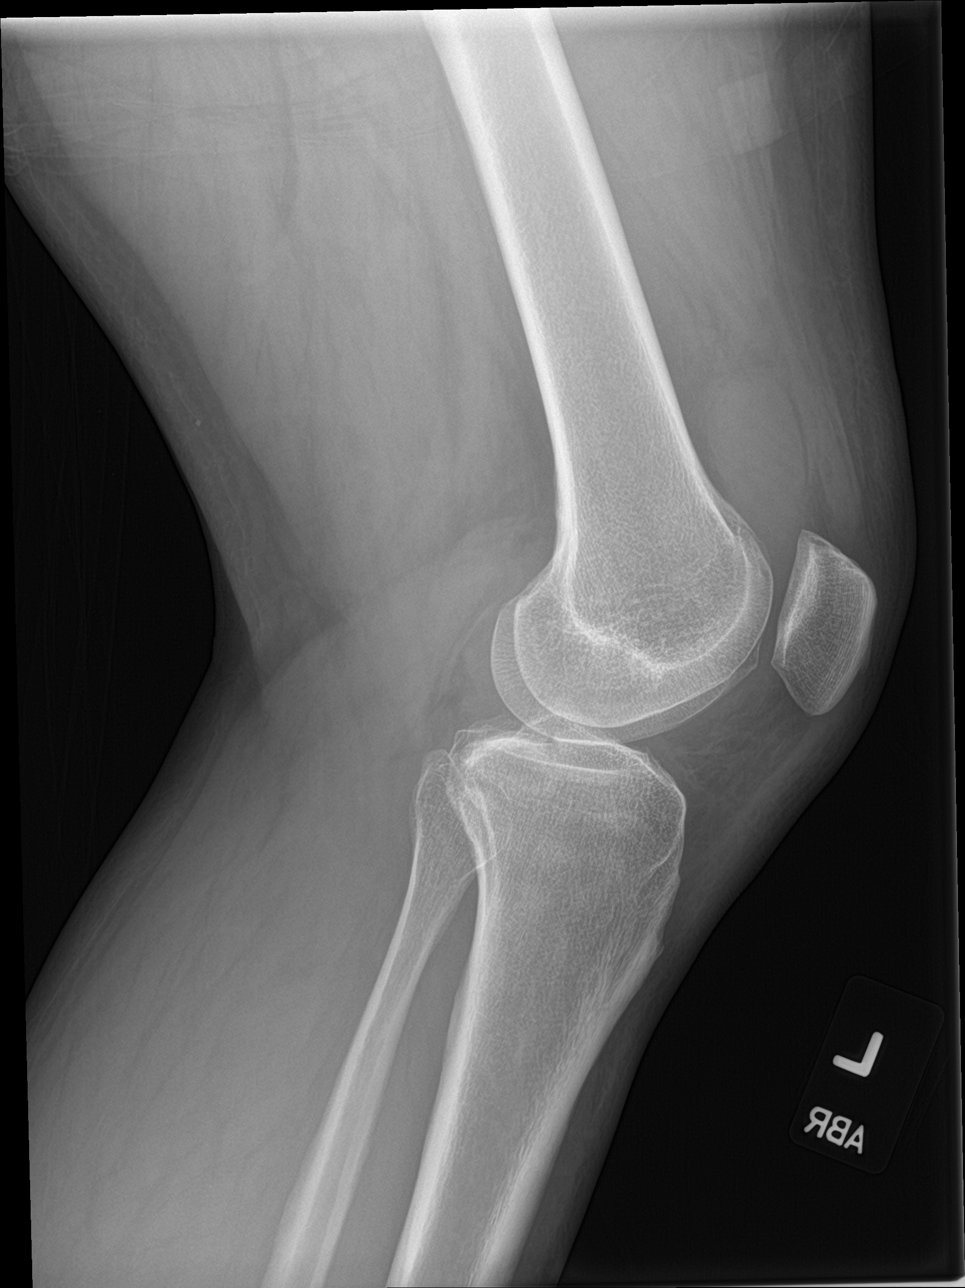

[knee ap (2 of 3)]
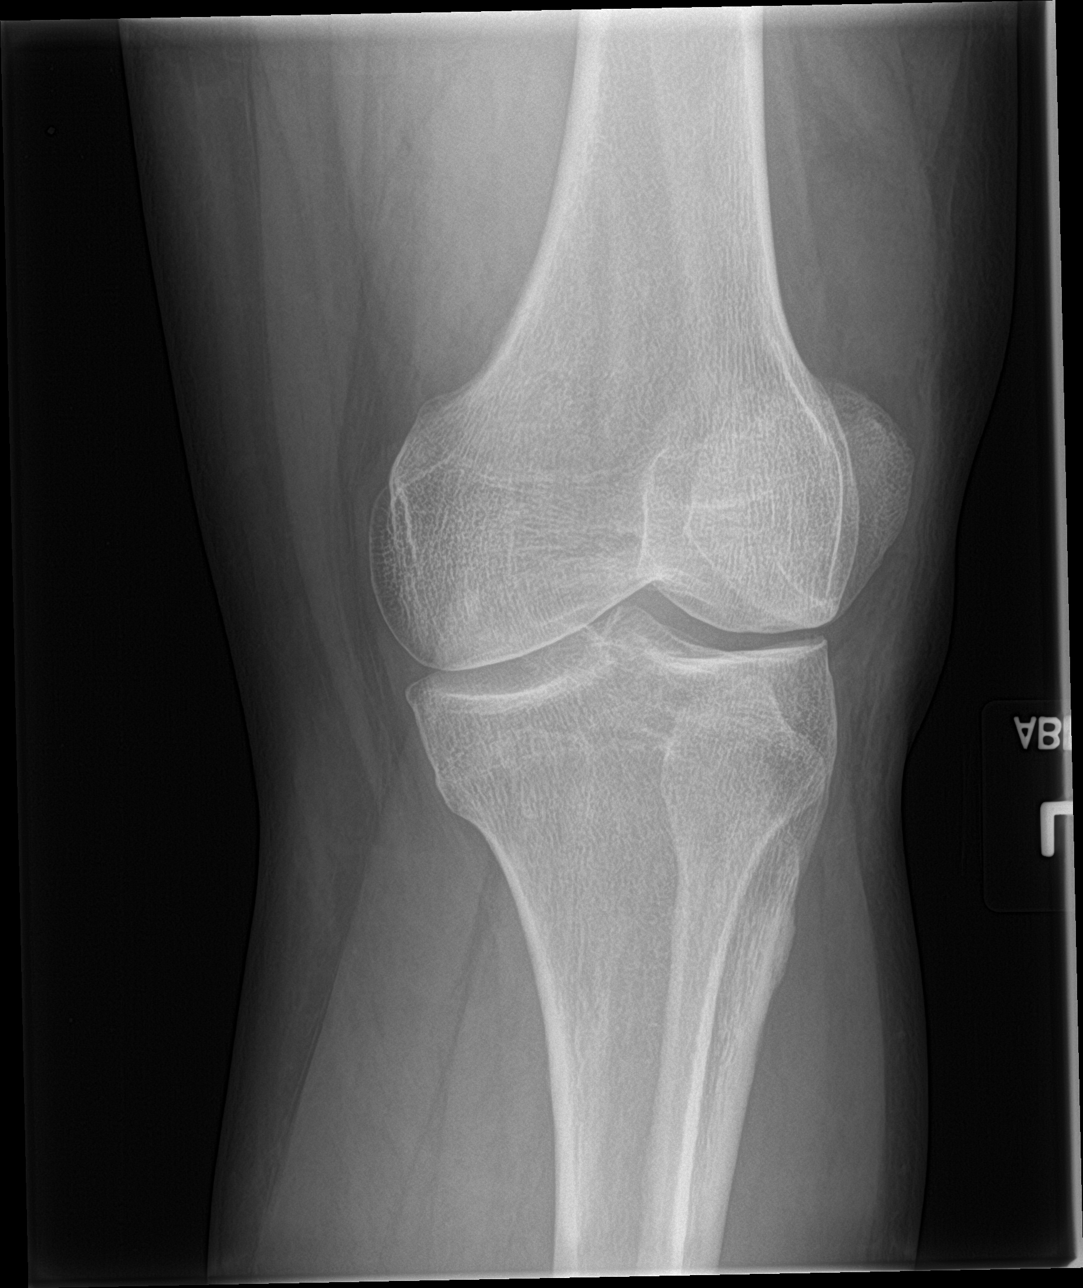

[knee ap (3 of 3)]
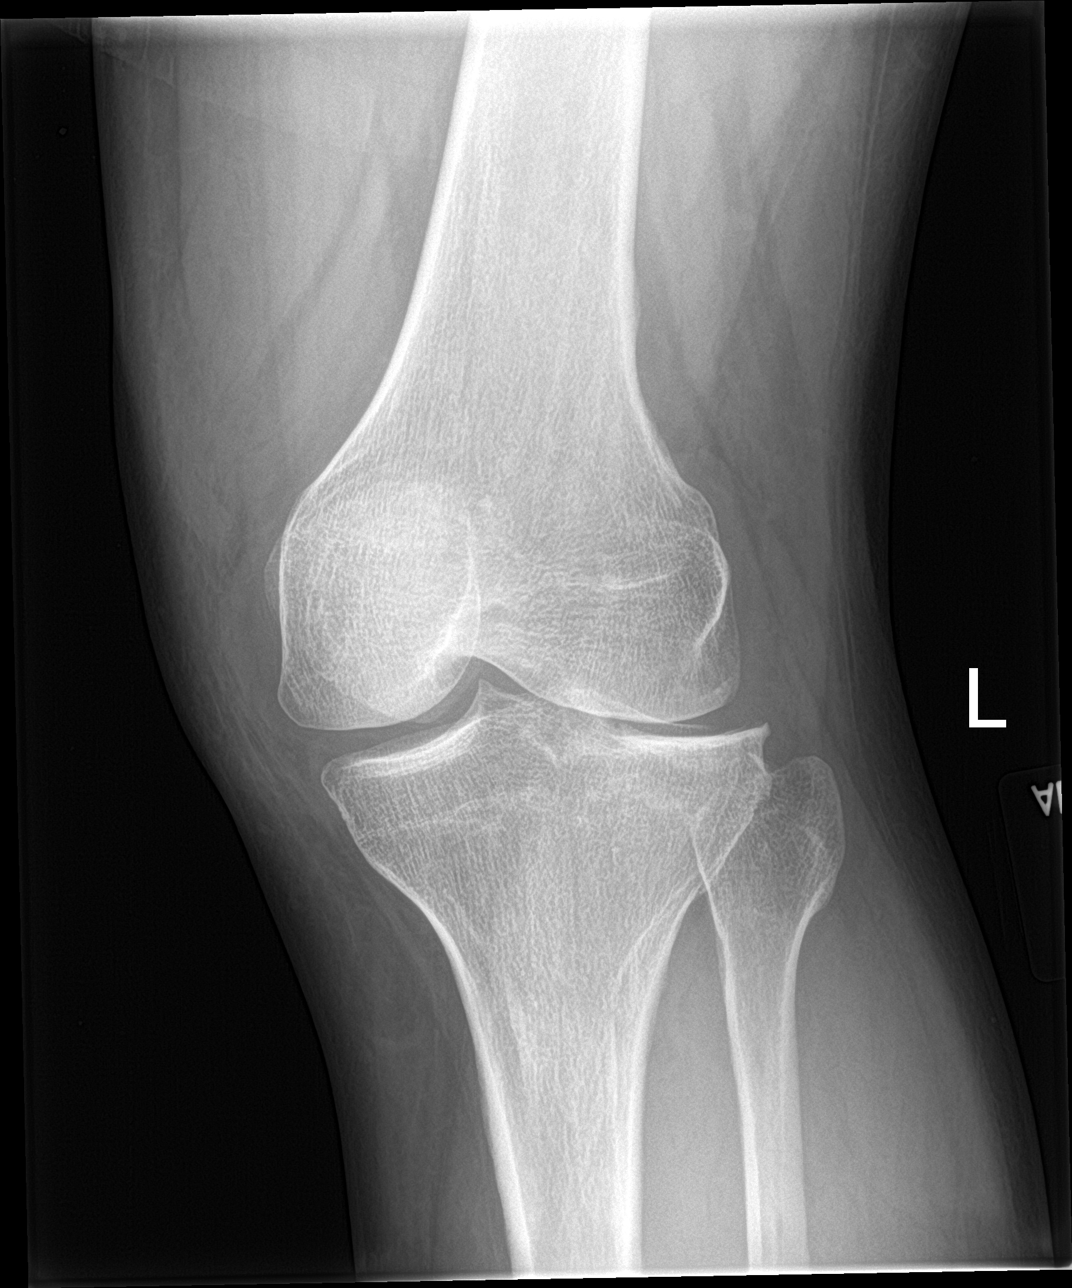

[4 of 4 positions shown; findings below may reference images not displayed]

FINDINGS: Mild arthritis.  No fracture.  Moderate joint effusion.
IMPRESSION: Joint effusion

## 2017-07-19 DIAGNOSIS — G4733 Obstructive sleep apnea (adult) (pediatric): Secondary | ICD-10-CM | POA: Diagnosis not present

## 2017-07-20 ENCOUNTER — Telehealth: Payer: Self-pay | Admitting: Pulmonary Disease

## 2017-07-20 DIAGNOSIS — G4733 Obstructive sleep apnea (adult) (pediatric): Secondary | ICD-10-CM

## 2017-07-20 NOTE — Telephone Encounter (Signed)
Pt is aware of results and voiced his understanding. cpap therapy has been ordered. Pt has been scheduled for f/u on 08-25-17. Nothing further is needed.    Maurene Capesotts, Cherina M, CMA   07/20/17 3:54 PM  Note    Per RA, HST showed 103 events per hour, severe OSA. He recommends auto cpap 5-20cm, mask of choice, humidity. DL and F/U in 3 weeks with either RA or TP.   Left message for patient to call back for results.

## 2017-07-20 NOTE — Telephone Encounter (Signed)
Per RA, HST showed 103 events per hour, severe OSA. He recommends auto cpap 5-20cm, mask of choice, humidity. DL and F/U in 3 weeks with either RA or TP.   Left message for patient to call back for results.

## 2017-07-21 ENCOUNTER — Other Ambulatory Visit: Payer: Self-pay | Admitting: *Deleted

## 2017-07-21 DIAGNOSIS — G4733 Obstructive sleep apnea (adult) (pediatric): Secondary | ICD-10-CM

## 2017-07-26 ENCOUNTER — Institutional Professional Consult (permissible substitution): Payer: 59 | Admitting: Pulmonary Disease

## 2017-07-28 DIAGNOSIS — G4733 Obstructive sleep apnea (adult) (pediatric): Secondary | ICD-10-CM | POA: Diagnosis not present

## 2017-08-25 ENCOUNTER — Ambulatory Visit: Payer: BLUE CROSS/BLUE SHIELD | Admitting: Pulmonary Disease

## 2017-08-31 ENCOUNTER — Ambulatory Visit: Payer: BLUE CROSS/BLUE SHIELD | Admitting: Pulmonary Disease

## 2017-08-31 ENCOUNTER — Encounter: Payer: Self-pay | Admitting: Pulmonary Disease

## 2017-08-31 VITALS — BP 132/76 | HR 82 | Ht 72.0 in | Wt 281.6 lb

## 2017-08-31 DIAGNOSIS — Z9989 Dependence on other enabling machines and devices: Secondary | ICD-10-CM

## 2017-08-31 DIAGNOSIS — G4733 Obstructive sleep apnea (adult) (pediatric): Secondary | ICD-10-CM

## 2017-08-31 MED ORDER — AZELASTINE-FLUTICASONE 137-50 MCG/ACT NA SUSP: 1.0000 | Freq: Every day | NASAL | 2 refills | Status: DC

## 2017-08-31 NOTE — Assessment & Plan Note (Signed)
Had dramatic improvement with his daytime somnolence and fatigue on auto CPAP. We will give him Dymista for nasal congestion. We will continue auto CPAP settings since his average pressure required is somewhat high   compliance with goal of at least 4-6 hrs every night is the expectation. Advised against medications with sedative side effects Cautioned against driving when sleepy - understanding that sleepiness will vary on a day to day basis

## 2017-08-31 NOTE — Assessment & Plan Note (Signed)
Weight loss encouraged,-about 10-20 pounds over the next year

## 2017-08-31 NOTE — Progress Notes (Signed)
   Subjective:    Patient ID: Gavin Wilson, male    DOB: 06/16/1980, 37 y.o.   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 He denies any problems with mask or pressure and seems to have adjusted well. He does not have significant nasal congestion or dryness. CPAP download shows residual AHI of 3/hour on auto CPAP settings with average pressure of 15 cm but at times going as high as 18 cm, compliance is fantastic with 7.5 hours on average.  He reports some nasal congestion especially seasonal  Significant tests/ events reviewed  07/2017 HST AHI 103/h !  Review of Systems Patient denies significant dyspnea,cough, hemoptysis,  chest pain, palpitations, pedal edema, orthopnea, paroxysmal nocturnal dyspnea, lightheadedness, nausea, vomiting, abdominal or  leg pains      Objective:   Physical Exam  Gen. Pleasant, obese, in no distress ENT - no lesions, no post nasal drip Neck: No JVD, no thyromegaly, no carotid bruits Lungs: no use of accessory muscles, no dullness to percussion, decreased without rales or rhonchi  Cardiovascular: Rhythm regular, heart sounds  normal, no murmurs or gallops, no peripheral edema Musculoskeletal: No deformities, no cyanosis or clubbing , no tremors        Assessment & Plan:

## 2017-08-31 NOTE — Patient Instructions (Signed)
CPAP is working Adult nursewonderfully well. Events are decreased and normal. I am pleased with your compliance. CPAP supplies will be renewed for a year

## 2017-09-01 ENCOUNTER — Telehealth: Payer: Self-pay

## 2017-09-01 NOTE — Telephone Encounter (Signed)
[facility] [employer]. I have attempted to start PA process, however I am needing an dx for Dymista. Only listed dx on problem list are OSA and Obesity.   RA please advise on dx.

## 2017-09-01 NOTE — Telephone Encounter (Signed)
Okay to change to Nasonex or Flonase whichever is covered

## 2017-09-01 NOTE — Telephone Encounter (Signed)
lmtcb for pt. Routing to CP for follow up as this was not generated in triage. Will send in new rx after speaking to pt

## 2017-09-02 NOTE — Telephone Encounter (Signed)
LMTCB 09/02/17

## 2017-09-08 NOTE — Telephone Encounter (Signed)
LMTCB on preferred phone number listed for patient. 09/08/2017

## 2017-09-18 DIAGNOSIS — G4733 Obstructive sleep apnea (adult) (pediatric): Secondary | ICD-10-CM | POA: Diagnosis not present

## 2017-10-18 DIAGNOSIS — G4733 Obstructive sleep apnea (adult) (pediatric): Secondary | ICD-10-CM | POA: Diagnosis not present

## 2017-12-07 ENCOUNTER — Encounter: Payer: Self-pay | Admitting: Adult Health

## 2017-12-07 ENCOUNTER — Ambulatory Visit: Payer: BLUE CROSS/BLUE SHIELD | Admitting: Adult Health

## 2017-12-07 DIAGNOSIS — E669 Obesity, unspecified: Secondary | ICD-10-CM

## 2017-12-07 DIAGNOSIS — G4733 Obstructive sleep apnea (adult) (pediatric): Secondary | ICD-10-CM

## 2017-12-07 NOTE — Progress Notes (Signed)
 @Patient  ID: Gavin Wilson, male    DOB: 09/22/1980, 37 y.o.   MRN: [id number]  Chief Complaint  Patient presents with  . Follow-up    OSA     Referring provider: Benita StabileHall, John Z, MD  HPI: 37 year old male followed for severe sleep apnea  Significant tests/ events reviewed  07/2017 HST AHI 103/h !  12/07/2017 Follow up : OSA  Patient presents for 4721-month follow-up.  Patient has underlying very severe sleep apnea.  He is on CPAP at bedtime.  Patient says he feels a lot better since starting CPAP.  He has decreased daytime sleepiness.  Download shows excellent compliance with average usage at 8 hours.  Patient is on auto CPAP 5-20 cm H2O.  AHI 3.2.  Minimal leaks.   No Known Allergies  Immunization History  Administered Date(s) Administered  . Influenza-Unspecified 06/23/2017    Past Medical History:  Diagnosis Date  . Bell's palsy   . Gout     Tobacco History: Social History   Tobacco Use  Smoking Status Never Smoker  Smokeless Tobacco Never Used   Counseling given: Not Answered   Outpatient Encounter Medications as of 12/07/2017  Medication Sig  . Azelastine-Fluticasone 137-50 MCG/ACT SUSP Place 1 spray into both nostrils daily.  . colchicine 0.6 MG tablet Take 0.6 mg by mouth as needed.  Marland Kitchen. levocetirizine (XYZAL) 5 MG tablet Take 5 mg by mouth every evening.  . sertraline (ZOLOFT) 25 MG tablet 100 mg.    No facility-administered encounter medications on file as of 12/07/2017.      Review of Systems  Constitutional:   No  weight loss, night sweats,  Fevers, chills, fatigue, or  lassitude.  HEENT:   No headaches,  Difficulty swallowing,  Tooth/dental problems, or  Sore throat,                No sneezing, itching, ear ache, nasal congestion, post nasal drip,   CV:  No chest pain,  Orthopnea, PND, swelling in lower extremities, anasarca, dizziness, palpitations, syncope.   GI  No heartburn, indigestion, abdominal pain, nausea, vomiting, diarrhea, change in  bowel habits, loss of appetite, bloody stools.   Resp: No shortness of breath with exertion or at rest.  No excess mucus, no productive cough,  No non-productive cough,  No coughing up of blood.  No change in color of mucus.  No wheezing.  No chest wall deformity  Skin: no rash or lesions.  GU: no dysuria, change in color of urine, no urgency or frequency.  No flank pain, no hematuria   MS:  No joint pain or swelling.  No decreased range of motion.  No back pain.    Physical Exam  BP 128/80 (BP Location: Left Arm, Cuff Size: Normal)   Pulse 78   Ht 5\' 11"  (1.803 m)   Wt 274 lb (124.3 kg)   SpO2 97%   BMI 38.22 kg/m   GEN: A/Ox3; pleasant , NAD, obese   HEENT:  [place]/AT,  EACs-clear, TMs-wnl, NOSE-clear, THROAT-clear, no lesions, no postnasal drip or exudate noted.  Class III MP airway   NECK:  Supple w/ fair ROM; no JVD; normal carotid impulses w/o bruits; no thyromegaly or nodules palpated; no lymphadenopathy.    RESP  Clear  P & A; w/o, wheezes/ rales/ or rhonchi. no accessory muscle use, no dullness to percussion  CARD:  RRR, no m/r/g, no peripheral edema, pulses intact, no cyanosis or clubbing.  GI:   Soft & nt; nml  bowel sounds; no organomegaly or masses detected.   Musco: Warm bil, no deformities or joint swelling noted.   Neuro: alert, no focal deficits noted.    Skin: Warm, no lesions or rashes    Lab Results:  CBC  BNP No results found for: BNP  ProBNP No results found for: PROBNP  Imaging: No results found.   Assessment & Plan:   OSA (obstructive sleep apnea) Doing well on CPAP   Plan  Patient Instructions  Continue on CPAP at bedtime Keep up the good work Work on healthy weight Do not drive a sleepy Follow-up in 1 year with Dr. Vassie Loll and as needed     Obesity (BMI 35.0-39.9 without comorbidity) Wt loss      Rubye Oaks, NP 12/07/2017

## 2017-12-07 NOTE — Assessment & Plan Note (Signed)
Wt loss  

## 2017-12-07 NOTE — Patient Instructions (Signed)
Continue on CPAP at bedtime Keep up the good work Work on [employer] weight Do not drive a sleepy Follow-up in 1 year with Dr. Vassie LollAlva and as needed

## 2017-12-07 NOTE — Assessment & Plan Note (Signed)
Doing well on CPAP   Plan  Patient Instructions  Continue on CPAP at bedtime Keep up the good work Work on healthy weight Do not drive a sleepy Follow-up in 1 year with Dr. Vassie LollAlva and as needed

## 2017-12-10 ENCOUNTER — Ambulatory Visit: Payer: BLUE CROSS/BLUE SHIELD | Admitting: Adult Health

## 2018-01-18 DIAGNOSIS — G4733 Obstructive sleep apnea (adult) (pediatric): Secondary | ICD-10-CM | POA: Diagnosis not present

## 2018-02-18 DIAGNOSIS — G4733 Obstructive sleep apnea (adult) (pediatric): Secondary | ICD-10-CM | POA: Diagnosis not present

## 2018-04-20 DIAGNOSIS — G4733 Obstructive sleep apnea (adult) (pediatric): Secondary | ICD-10-CM | POA: Diagnosis not present

## 2018-05-20 DIAGNOSIS — G4733 Obstructive sleep apnea (adult) (pediatric): Secondary | ICD-10-CM | POA: Diagnosis not present

## 2018-07-21 DIAGNOSIS — G4733 Obstructive sleep apnea (adult) (pediatric): Secondary | ICD-10-CM | POA: Diagnosis not present

## 2018-09-06 DIAGNOSIS — L299 Pruritus, unspecified: Secondary | ICD-10-CM | POA: Diagnosis not present

## 2018-09-06 DIAGNOSIS — L237 Allergic contact dermatitis due to plants, except food: Secondary | ICD-10-CM | POA: Diagnosis not present

## 2018-09-13 DIAGNOSIS — E785 Hyperlipidemia, unspecified: Secondary | ICD-10-CM | POA: Diagnosis not present

## 2018-09-13 DIAGNOSIS — I10 Essential (primary) hypertension: Secondary | ICD-10-CM | POA: Diagnosis not present

## 2018-09-13 DIAGNOSIS — R030 Elevated blood-pressure reading, without diagnosis of hypertension: Secondary | ICD-10-CM | POA: Diagnosis not present

## 2018-09-13 DIAGNOSIS — E7849 Other hyperlipidemia: Secondary | ICD-10-CM | POA: Diagnosis not present

## 2018-09-13 DIAGNOSIS — R7309 Other abnormal glucose: Secondary | ICD-10-CM | POA: Diagnosis not present

## 2018-09-13 DIAGNOSIS — Z131 Encounter for screening for diabetes mellitus: Secondary | ICD-10-CM | POA: Diagnosis not present

## 2018-10-28 DIAGNOSIS — J309 Allergic rhinitis, unspecified: Secondary | ICD-10-CM | POA: Diagnosis not present

## 2019-07-11 DIAGNOSIS — Z6841 Body Mass Index (BMI) 40.0 and over, adult: Secondary | ICD-10-CM | POA: Diagnosis not present

## 2019-07-11 DIAGNOSIS — R002 Palpitations: Secondary | ICD-10-CM | POA: Diagnosis not present

## 2019-07-11 DIAGNOSIS — I10 Essential (primary) hypertension: Secondary | ICD-10-CM | POA: Diagnosis not present

## 2019-07-24 DIAGNOSIS — L600 Ingrowing nail: Secondary | ICD-10-CM | POA: Diagnosis not present

## 2019-07-25 DIAGNOSIS — E669 Obesity, unspecified: Secondary | ICD-10-CM | POA: Diagnosis not present

## 2019-07-25 DIAGNOSIS — I10 Essential (primary) hypertension: Secondary | ICD-10-CM | POA: Diagnosis not present

## 2019-07-25 DIAGNOSIS — R002 Palpitations: Secondary | ICD-10-CM | POA: Diagnosis not present

## 2019-08-07 DIAGNOSIS — L600 Ingrowing nail: Secondary | ICD-10-CM | POA: Diagnosis not present

## 2019-08-07 DIAGNOSIS — M79674 Pain in right toe(s): Secondary | ICD-10-CM | POA: Diagnosis not present

## 2019-12-27 DIAGNOSIS — J351 Hypertrophy of tonsils: Secondary | ICD-10-CM | POA: Diagnosis not present

## 2019-12-27 DIAGNOSIS — F419 Anxiety disorder, unspecified: Secondary | ICD-10-CM | POA: Diagnosis not present

## 2019-12-27 DIAGNOSIS — Z23 Encounter for immunization: Secondary | ICD-10-CM | POA: Diagnosis not present

## 2019-12-27 DIAGNOSIS — R002 Palpitations: Secondary | ICD-10-CM | POA: Diagnosis not present

## 2019-12-27 DIAGNOSIS — G4730 Sleep apnea, unspecified: Secondary | ICD-10-CM | POA: Diagnosis not present

## 2019-12-27 DIAGNOSIS — R5383 Other fatigue: Secondary | ICD-10-CM | POA: Diagnosis not present

## 2020-01-15 DIAGNOSIS — Z0001 Encounter for general adult medical examination with abnormal findings: Secondary | ICD-10-CM | POA: Diagnosis not present

## 2020-02-21 DIAGNOSIS — M9906 Segmental and somatic dysfunction of lower extremity: Secondary | ICD-10-CM | POA: Diagnosis not present

## 2020-02-21 DIAGNOSIS — M545 Low back pain: Secondary | ICD-10-CM | POA: Diagnosis not present

## 2020-02-21 DIAGNOSIS — M9903 Segmental and somatic dysfunction of lumbar region: Secondary | ICD-10-CM | POA: Diagnosis not present

## 2020-02-21 DIAGNOSIS — M5442 Lumbago with sciatica, left side: Secondary | ICD-10-CM | POA: Diagnosis not present

## 2020-02-21 DIAGNOSIS — M9902 Segmental and somatic dysfunction of thoracic region: Secondary | ICD-10-CM | POA: Diagnosis not present

## 2020-02-22 DIAGNOSIS — M9903 Segmental and somatic dysfunction of lumbar region: Secondary | ICD-10-CM | POA: Diagnosis not present

## 2020-02-22 DIAGNOSIS — M9902 Segmental and somatic dysfunction of thoracic region: Secondary | ICD-10-CM | POA: Diagnosis not present

## 2020-02-22 DIAGNOSIS — M9906 Segmental and somatic dysfunction of lower extremity: Secondary | ICD-10-CM | POA: Diagnosis not present

## 2020-02-22 DIAGNOSIS — M545 Low back pain: Secondary | ICD-10-CM | POA: Diagnosis not present

## 2020-02-22 DIAGNOSIS — M5442 Lumbago with sciatica, left side: Secondary | ICD-10-CM | POA: Diagnosis not present

## 2020-02-27 DIAGNOSIS — M9906 Segmental and somatic dysfunction of lower extremity: Secondary | ICD-10-CM | POA: Diagnosis not present

## 2020-02-27 DIAGNOSIS — M9902 Segmental and somatic dysfunction of thoracic region: Secondary | ICD-10-CM | POA: Diagnosis not present

## 2020-02-27 DIAGNOSIS — M5442 Lumbago with sciatica, left side: Secondary | ICD-10-CM | POA: Diagnosis not present

## 2020-02-27 DIAGNOSIS — M545 Low back pain: Secondary | ICD-10-CM | POA: Diagnosis not present

## 2020-02-27 DIAGNOSIS — M9903 Segmental and somatic dysfunction of lumbar region: Secondary | ICD-10-CM | POA: Diagnosis not present

## 2020-03-05 DIAGNOSIS — M5442 Lumbago with sciatica, left side: Secondary | ICD-10-CM | POA: Diagnosis not present

## 2020-03-05 DIAGNOSIS — M9903 Segmental and somatic dysfunction of lumbar region: Secondary | ICD-10-CM | POA: Diagnosis not present

## 2020-03-05 DIAGNOSIS — M9906 Segmental and somatic dysfunction of lower extremity: Secondary | ICD-10-CM | POA: Diagnosis not present

## 2020-03-05 DIAGNOSIS — M9902 Segmental and somatic dysfunction of thoracic region: Secondary | ICD-10-CM | POA: Diagnosis not present

## 2020-05-24 DIAGNOSIS — I10 Essential (primary) hypertension: Secondary | ICD-10-CM | POA: Diagnosis not present

## 2020-05-24 DIAGNOSIS — L299 Pruritus, unspecified: Secondary | ICD-10-CM | POA: Diagnosis not present

## 2020-05-24 DIAGNOSIS — R5383 Other fatigue: Secondary | ICD-10-CM | POA: Diagnosis not present

## 2020-05-24 DIAGNOSIS — E1169 Type 2 diabetes mellitus with other specified complication: Secondary | ICD-10-CM | POA: Diagnosis not present

## 2020-05-24 DIAGNOSIS — Z0001 Encounter for general adult medical examination with abnormal findings: Secondary | ICD-10-CM | POA: Diagnosis not present

## 2020-05-24 DIAGNOSIS — R7301 Impaired fasting glucose: Secondary | ICD-10-CM | POA: Diagnosis not present

## 2020-05-24 DIAGNOSIS — Z6839 Body mass index (BMI) 39.0-39.9, adult: Secondary | ICD-10-CM | POA: Diagnosis not present

## 2020-05-29 DIAGNOSIS — F419 Anxiety disorder, unspecified: Secondary | ICD-10-CM | POA: Diagnosis not present

## 2020-05-29 DIAGNOSIS — E1169 Type 2 diabetes mellitus with other specified complication: Secondary | ICD-10-CM | POA: Diagnosis not present

## 2020-05-29 DIAGNOSIS — G4733 Obstructive sleep apnea (adult) (pediatric): Secondary | ICD-10-CM | POA: Diagnosis not present

## 2020-05-29 DIAGNOSIS — Z23 Encounter for immunization: Secondary | ICD-10-CM | POA: Diagnosis not present

## 2020-09-02 DIAGNOSIS — J351 Hypertrophy of tonsils: Secondary | ICD-10-CM | POA: Diagnosis not present

## 2020-09-02 DIAGNOSIS — G4730 Sleep apnea, unspecified: Secondary | ICD-10-CM | POA: Diagnosis not present

## 2020-09-02 DIAGNOSIS — F419 Anxiety disorder, unspecified: Secondary | ICD-10-CM | POA: Diagnosis not present

## 2020-09-02 DIAGNOSIS — R002 Palpitations: Secondary | ICD-10-CM | POA: Diagnosis not present

## 2020-09-02 DIAGNOSIS — Z23 Encounter for immunization: Secondary | ICD-10-CM | POA: Diagnosis not present

## 2020-09-02 DIAGNOSIS — R5383 Other fatigue: Secondary | ICD-10-CM | POA: Diagnosis not present

## 2020-09-05 DIAGNOSIS — E1169 Type 2 diabetes mellitus with other specified complication: Secondary | ICD-10-CM | POA: Diagnosis not present

## 2020-09-05 DIAGNOSIS — G4733 Obstructive sleep apnea (adult) (pediatric): Secondary | ICD-10-CM | POA: Diagnosis not present

## 2020-09-05 DIAGNOSIS — F419 Anxiety disorder, unspecified: Secondary | ICD-10-CM | POA: Diagnosis not present

## 2020-09-05 DIAGNOSIS — E782 Mixed hyperlipidemia: Secondary | ICD-10-CM | POA: Diagnosis not present

## 2020-10-03 DIAGNOSIS — D485 Neoplasm of uncertain behavior of skin: Secondary | ICD-10-CM | POA: Diagnosis not present

## 2020-10-03 DIAGNOSIS — E1169 Type 2 diabetes mellitus with other specified complication: Secondary | ICD-10-CM | POA: Diagnosis not present

## 2020-10-03 DIAGNOSIS — D224 Melanocytic nevi of scalp and neck: Secondary | ICD-10-CM | POA: Diagnosis not present

## 2020-10-24 DIAGNOSIS — D224 Melanocytic nevi of scalp and neck: Secondary | ICD-10-CM | POA: Diagnosis not present

## 2020-10-24 DIAGNOSIS — D485 Neoplasm of uncertain behavior of skin: Secondary | ICD-10-CM | POA: Diagnosis not present

## 2020-11-02 ENCOUNTER — Inpatient Hospital Stay (HOSPITAL_COMMUNITY)
Admission: EM | Admit: 2020-11-02 | Discharge: 2020-11-04 | DRG: 392 | Disposition: A | Discharge status: Home / Self Care | Payer: BC Managed Care – PPO | Attending: Internal Medicine | Admitting: Internal Medicine

## 2020-11-02 ENCOUNTER — Other Ambulatory Visit: Payer: Self-pay

## 2020-11-02 ENCOUNTER — Emergency Department (HOSPITAL_COMMUNITY): Payer: BC Managed Care – PPO

## 2020-11-02 ENCOUNTER — Encounter (HOSPITAL_COMMUNITY): Payer: Self-pay | Admitting: *Deleted

## 2020-11-02 DIAGNOSIS — Z6841 Body Mass Index (BMI) 40.0 and over, adult: Secondary | ICD-10-CM

## 2020-11-02 DIAGNOSIS — I10 Essential (primary) hypertension: Secondary | ICD-10-CM | POA: Diagnosis not present

## 2020-11-02 DIAGNOSIS — R1031 Right lower quadrant pain: Secondary | ICD-10-CM

## 2020-11-02 DIAGNOSIS — M25562 Pain in left knee: Secondary | ICD-10-CM

## 2020-11-02 DIAGNOSIS — R112 Nausea with vomiting, unspecified: Secondary | ICD-10-CM

## 2020-11-02 DIAGNOSIS — Z20822 Contact with and (suspected) exposure to covid-19: Secondary | ICD-10-CM | POA: Diagnosis not present

## 2020-11-02 DIAGNOSIS — K760 Fatty (change of) liver, not elsewhere classified: Secondary | ICD-10-CM

## 2020-11-02 DIAGNOSIS — R9431 Abnormal electrocardiogram [ECG] [EKG]: Secondary | ICD-10-CM | POA: Diagnosis present

## 2020-11-02 DIAGNOSIS — G8929 Other chronic pain: Secondary | ICD-10-CM

## 2020-11-02 DIAGNOSIS — M109 Gout, unspecified: Secondary | ICD-10-CM

## 2020-11-02 DIAGNOSIS — E871 Hypo-osmolality and hyponatremia: Secondary | ICD-10-CM | POA: Diagnosis present

## 2020-11-02 DIAGNOSIS — D75839 Thrombocytosis, unspecified: Secondary | ICD-10-CM

## 2020-11-02 DIAGNOSIS — G4733 Obstructive sleep apnea (adult) (pediatric): Secondary | ICD-10-CM | POA: Diagnosis not present

## 2020-11-02 DIAGNOSIS — Z79899 Other long term (current) drug therapy: Secondary | ICD-10-CM | POA: Diagnosis not present

## 2020-11-02 DIAGNOSIS — R000 Tachycardia, unspecified: Secondary | ICD-10-CM | POA: Diagnosis not present

## 2020-11-02 DIAGNOSIS — K4090 Unilateral inguinal hernia, without obstruction or gangrene, not specified as recurrent: Secondary | ICD-10-CM | POA: Diagnosis not present

## 2020-11-02 DIAGNOSIS — E1165 Type 2 diabetes mellitus with hyperglycemia: Secondary | ICD-10-CM

## 2020-11-02 DIAGNOSIS — E860 Dehydration: Secondary | ICD-10-CM

## 2020-11-02 DIAGNOSIS — E876 Hypokalemia: Secondary | ICD-10-CM | POA: Diagnosis present

## 2020-11-02 DIAGNOSIS — K529 Noninfective gastroenteritis and colitis, unspecified: Principal | ICD-10-CM

## 2020-11-02 DIAGNOSIS — N179 Acute kidney failure, unspecified: Secondary | ICD-10-CM | POA: Diagnosis not present

## 2020-11-02 DIAGNOSIS — N200 Calculus of kidney: Secondary | ICD-10-CM | POA: Diagnosis not present

## 2020-11-02 DIAGNOSIS — D72823 Leukemoid reaction: Secondary | ICD-10-CM | POA: Diagnosis present

## 2020-11-02 DIAGNOSIS — R109 Unspecified abdominal pain: Secondary | ICD-10-CM

## 2020-11-02 DIAGNOSIS — M02362 Reiter's disease, left knee: Secondary | ICD-10-CM | POA: Diagnosis present

## 2020-11-02 DIAGNOSIS — R1011 Right upper quadrant pain: Secondary | ICD-10-CM

## 2020-11-02 DIAGNOSIS — E785 Hyperlipidemia, unspecified: Secondary | ICD-10-CM

## 2020-11-02 DIAGNOSIS — E782 Mixed hyperlipidemia: Secondary | ICD-10-CM

## 2020-11-02 DIAGNOSIS — E119 Type 2 diabetes mellitus without complications: Secondary | ICD-10-CM | POA: Diagnosis present

## 2020-11-02 DIAGNOSIS — R197 Diarrhea, unspecified: Secondary | ICD-10-CM | POA: Diagnosis present

## 2020-11-02 DIAGNOSIS — R1111 Vomiting without nausea: Secondary | ICD-10-CM | POA: Diagnosis not present

## 2020-11-02 DIAGNOSIS — E66813 Obesity, class 3: Secondary | ICD-10-CM

## 2020-11-02 DIAGNOSIS — E7800 Pure hypercholesterolemia, unspecified: Secondary | ICD-10-CM | POA: Diagnosis not present

## 2020-11-02 HISTORY — DX: Essential (primary) hypertension: I10

## 2020-11-02 HISTORY — DX: Pure hypercholesterolemia, unspecified: E78.00

## 2020-11-02 HISTORY — DX: Type 2 diabetes mellitus without complications: E11.9

## 2020-11-02 LAB — COMPREHENSIVE METABOLIC PANEL
ALT: 23 U/L (ref 0–44)
AST: 13 U/L — ABNORMAL LOW (ref 15–41)
Albumin: 3.8 g/dL (ref 3.5–5.0)
Alkaline Phosphatase: 70 U/L (ref 38–126)
Anion gap: 10 (ref 5–15)
BUN: 8 mg/dL (ref 6–20)
CO2: 23 mmol/L (ref 22–32)
Calcium: 8.6 mg/dL — ABNORMAL LOW (ref 8.9–10.3)
Chloride: 103 mmol/L (ref 98–111)
Creatinine, Ser: 1.17 mg/dL (ref 0.61–1.24)
GFR, Estimated: >60 mL/min (ref >60)
Glucose, Bld: 148 mg/dL — ABNORMAL HIGH (ref 70–99)
Potassium: 3.2 mmol/L — ABNORMAL LOW (ref 3.5–5.1)
Sodium: 136 mmol/L (ref 135–145)
Total Bilirubin: 0.8 mg/dL (ref 0.3–1.2)
Total Protein: 7.4 g/dL (ref 6.5–8.1)

## 2020-11-02 LAB — URINALYSIS, ROUTINE W REFLEX MICROSCOPIC
Bacteria, UA: NONE SEEN
Bilirubin Urine: NEGATIVE
Glucose, UA: NEGATIVE mg/dL
Hgb urine dipstick: NEGATIVE
Ketones, ur: 20 mg/dL — AB
Leukocytes,Ua: NEGATIVE
Nitrite: NEGATIVE
Protein, ur: 30 mg/dL — AB
Specific Gravity, Urine: 1.025 (ref 1.005–1.030)
pH: 5 (ref 5.0–8.0)

## 2020-11-02 LAB — CBC WITH DIFFERENTIAL/PLATELET
Abs Immature Granulocytes: 0.1 10*3/uL — ABNORMAL HIGH (ref 0.00–0.07)
Basophils Absolute: 0.1 10*3/uL (ref 0.0–0.1)
Basophils Relative: 1 %
Eosinophils Absolute: 0.4 10*3/uL (ref 0.0–0.5)
Eosinophils Relative: 4 %
HCT: 54.9 % — ABNORMAL HIGH (ref 39.0–52.0)
Hemoglobin: 18.5 g/dL — ABNORMAL HIGH (ref 13.0–17.0)
Immature Granulocytes: 1 %
Lymphocytes Relative: 15 %
Lymphs Abs: 1.5 10*3/uL (ref 0.7–4.0)
MCH: 30.5 pg (ref 26.0–34.0)
MCHC: 33.7 g/dL (ref 30.0–36.0)
MCV: 90.6 fL (ref 80.0–100.0)
Monocytes Absolute: 0.8 10*3/uL (ref 0.1–1.0)
Monocytes Relative: 8 %
Neutro Abs: 6.9 10*3/uL (ref 1.7–7.7)
Neutrophils Relative %: 71 %
Platelets: 475 10*3/uL — ABNORMAL HIGH (ref 150–400)
RBC: 6.06 MIL/uL — ABNORMAL HIGH (ref 4.22–5.81)
RDW: 14.2 % (ref 11.5–15.5)
WBC: 9.7 10*3/uL (ref 4.0–10.5)
nRBC: 0 % (ref 0.0–0.2)

## 2020-11-02 LAB — CBG MONITORING, ED: Glucose-Capillary: 178 mg/dL — ABNORMAL HIGH (ref 70–99)

## 2020-11-02 LAB — TROPONIN I (HIGH SENSITIVITY)
Troponin I (High Sensitivity): 6 ng/L (ref <18)
Troponin I (High Sensitivity): 7 ng/L (ref <18)

## 2020-11-02 LAB — LIPASE, BLOOD: Lipase: 24 U/L (ref 11–51)

## 2020-11-02 MED ORDER — ONDANSETRON HCL 4 MG/2ML IJ SOLN
4.0000 mg | Freq: Once | INTRAMUSCULAR | Status: AC
  Administered 2020-11-02: 4 mg via INTRAVENOUS
  Filled 2020-11-02: qty 2

## 2020-11-02 MED ORDER — HYDROMORPHONE HCL 1 MG/ML IJ SOLN
1.0000 mg | Freq: Once | INTRAMUSCULAR | Status: AC
  Administered 2020-11-02: 1 mg via INTRAVENOUS
  Filled 2020-11-02: qty 1

## 2020-11-02 MED ORDER — SODIUM CHLORIDE 0.9 % IV BOLUS
1000.0000 mL | Freq: Once | INTRAVENOUS | Status: AC
Start: 2020-11-02 — End: 2020-11-02
  Administered 2020-11-02: 1000 mL via INTRAVENOUS

## 2020-11-02 MED ORDER — METOCLOPRAMIDE HCL 5 MG/ML IJ SOLN
10.0000 mg | Freq: Once | INTRAMUSCULAR | Status: AC
  Administered 2020-11-02: 10 mg via INTRAVENOUS
  Filled 2020-11-02: qty 2

## 2020-11-02 MED ORDER — SODIUM CHLORIDE 0.9 % IV SOLN: Freq: Once | INTRAVENOUS | Status: AC

## 2020-11-02 MED ORDER — ENOXAPARIN SODIUM 40 MG/0.4ML IJ SOSY
40.0000 mg | PREFILLED_SYRINGE | INTRAMUSCULAR | Status: DC
  Administered 2020-11-03 – 2020-11-04 (×2): 40 mg via SUBCUTANEOUS
  Filled 2020-11-02 (×2): qty 0.4

## 2020-11-02 MED ORDER — SODIUM CHLORIDE 0.9 % IV BOLUS
1000.0000 mL | Freq: Once | INTRAVENOUS | Status: AC
  Administered 2020-11-02: 1000 mL via INTRAVENOUS

## 2020-11-02 MED ORDER — BOOST / RESOURCE BREEZE PO LIQD CUSTOM
1.0000 | Freq: Three times a day (TID) | ORAL | Status: DC
  Administered 2020-11-03 – 2020-11-04 (×4): 1 via ORAL

## 2020-11-02 NOTE — ED Notes (Signed)
Pt has drank 5 cups of water- no vomting

## 2020-11-02 NOTE — ED Provider Notes (Signed)
[facility] EMERGENCY DEPARTMENT Provider Note   CSN: [id number] Arrival date & time: 11/02/20  1414     History Chief Complaint  Patient presents with  . Diarrhea    N/v/d with abdominal pain    Gavin Wilson is a 40 y.o. male.  HPI      Gavin Wilson is a 40 y.o. male medical history of type 2 diabetes, hypertension who presents to the Emergency Department complaining of right-sided abdominal pain, nausea, vomiting, and diarrhea.  Symptoms been present for 5 days.  He also reports increased thirst.  Vomiting has subsided and he is now mostly having watery brown stools.  No bright red blood or melena.  He describes a sharp pain mostly to the upper right abdomen.  No fever or chills.  He denies any chest pain, shortness of breath, flank pain or dysuria.   Past Medical History:  Diagnosis Date  . Bell's palsy   . Diabetes mellitus without complication (HCC)   . Gout   . Hypercholesteremia   . Hypertension     Patient Active Problem List   Diagnosis Date Noted  . OSA (obstructive sleep apnea) 06/29/2017  . Obesity (BMI 35.0-39.9 without comorbidity) 06/29/2017    Past Surgical History:  Procedure Laterality Date  . KNEE SURGERY         History reviewed. No pertinent family history.  Social History   Tobacco Use  . Smoking status: Never Smoker  . Smokeless tobacco: Never Used  Substance Use Topics  . Alcohol use: No  . Drug use: No    Home Medications Prior to Admission medications   Medication Sig Start Date End Date Taking? Authorizing Provider  Azelastine-Fluticasone 137-50 MCG/ACT SUSP Place 1 spray into both nostrils daily. 08/31/17   Oretha Milch, MD  colchicine 0.6 MG tablet Take 0.6 mg by mouth as needed.    [provider]  levocetirizine (XYZAL) 5 MG tablet Take 5 mg by mouth every evening.    [provider]  sertraline (ZOLOFT) 25 MG tablet 100 mg.  05/28/17   [provider]    Allergies    Patient has  no known allergies.  Review of Systems   Review of Systems  Constitutional: Negative for chills, fatigue and fever.  HENT: Negative for sore throat and trouble swallowing.   Respiratory: Negative for shortness of breath and wheezing.   Cardiovascular: Negative for chest pain and palpitations.  Gastrointestinal: Positive for abdominal pain, diarrhea, nausea and vomiting.  Genitourinary: Negative for dysuria, flank pain and hematuria.  Musculoskeletal: Negative for arthralgias, back pain, myalgias, neck pain and neck stiffness.  Skin: Negative for rash.  Neurological: Negative for dizziness, weakness, numbness and headaches.  Hematological: Does not bruise/bleed easily.    Physical Exam Updated Vital Signs BP 120/83   Pulse (!) 117   Temp 98.1 F (36.7 C) (Oral)   Resp 16   Ht 5\' 11"  (1.803 m)   Wt 130.6 kg   SpO2 95%   BMI 40.17 kg/m   Physical Exam Vitals and nursing note reviewed.  Constitutional:      General: He is not in acute distress.    Appearance: Normal appearance. He is obese. He is not toxic-appearing.  Cardiovascular:     Rate and Rhythm: Regular rhythm. Tachycardia present.     Pulses: Normal pulses.  Pulmonary:     Effort: Pulmonary effort is normal. No respiratory distress.  Chest:     Chest wall: No tenderness.  Abdominal:     Palpations: Abdomen is soft.     Tenderness: There is abdominal tenderness. There is no right CVA tenderness, left CVA tenderness or guarding.     Comments: Tenderness of the right upper quadrant on exam.  No guarding or rebound tenderness.  No CVA tenderness.  Musculoskeletal:        General: Normal range of motion.  Skin:    General: Skin is warm.     Findings: No erythema or rash.  Neurological:     General: No focal deficit present.     Mental Status: He is alert.     Sensory: No sensory deficit.     Motor: No weakness.     ED Results / Procedures / Treatments   Labs (all labs ordered are listed, but only abnormal  results are displayed) Labs Reviewed  COMPREHENSIVE METABOLIC PANEL - Abnormal; Notable for the following components:      Result Value   Potassium 3.2 (*)    Glucose, Bld 148 (*)    Calcium 8.6 (*)    AST 13 (*)    All other components within normal limits  CBC WITH DIFFERENTIAL/PLATELET - Abnormal; Notable for the following components:   RBC 6.06 (*)    Hemoglobin 18.5 (*)    HCT 54.9 (*)    Platelets 475 (*)    Abs Immature Granulocytes 0.10 (*)    All other components within normal limits  CBG MONITORING, ED - Abnormal; Notable for the following components:   Glucose-Capillary 178 (*)    All other components within normal limits  LIPASE, BLOOD  URINALYSIS, ROUTINE W REFLEX MICROSCOPIC  TROPONIN I (HIGH SENSITIVITY)  TROPONIN I (HIGH SENSITIVITY)    EKG   EKG Interpretation  Date/Time: 11/02/20  Ventricular Rate: 121  PR Interval: 136  QRS Duration: 83  QT Interval: 360  QTC Calculation: 511  R Axis:    Text Interpretation: Sinus tachycardia.  Prolonged QT  EKG reviewed by Dr. Estell Harpin  Radiology CT ABDOMEN PELVIS WO CONTRAST  Result Date: 11/02/2020 CLINICAL DATA:  Epigastric abdominal pain, nausea, vomiting. EXAM: CT ABDOMEN AND PELVIS WITHOUT CONTRAST TECHNIQUE: Multidetector CT imaging of the abdomen and pelvis was performed following the standard protocol without IV contrast. COMPARISON:  None. FINDINGS: Lower chest: No acute abnormality. Hepatobiliary: No gallstones or biliary dilatation is noted. Hepatic steatosis is noted. Pancreas: Unremarkable. No pancreatic ductal dilatation or surrounding inflammatory changes. Spleen: Normal in size without focal abnormality. Adrenals/Urinary Tract: Adrenal glands appear normal. Small nonobstructive right renal calculus is noted. No hydronephrosis or renal obstruction is noted. Urinary bladder is unremarkable. Stomach/Bowel: Stomach is within normal limits. Appendix appears normal. No evidence of bowel wall thickening,  distention, or inflammatory changes. Vascular/Lymphatic: No significant vascular findings are present. No enlarged abdominal or pelvic lymph nodes. Reproductive: Prostate is unremarkable. Other: Small fat containing right inguinal hernia is noted. No ascites is noted. Musculoskeletal: No acute or significant osseous findings. IMPRESSION: Hepatic steatosis. Small nonobstructive right renal calculus. No hydronephrosis or renal obstruction is noted. Small fat containing right inguinal hernia. Electronically Signed   By: Lupita Raider M.D.   On: 11/02/2020 15:59   DG Chest 2 View  Result Date: 11/02/2020 CLINICAL DATA:  Tachycardia, diarrhea EXAM: CHEST - 2 VIEW COMPARISON:  10/02/2007 chest radiograph. FINDINGS: Stable cardiomediastinal silhouette with normal heart size. No pneumothorax. No pleural effusion. Lungs appear clear, with no acute consolidative airspace disease and no pulmonary edema. IMPRESSION: No active cardiopulmonary disease. Electronically Signed  By: Delbert Phenix M.D.   On: 11/02/2020 17:21    Procedures Procedures   Medications Ordered in ED Medications  sodium chloride 0.9 % bolus 1,000 mL (0 mLs Intravenous Stopped 11/02/20 1736)  ondansetron (ZOFRAN) injection 4 mg (4 mg Intravenous Given 11/02/20 1607)  HYDROmorphone (DILAUDID) injection 1 mg (1 mg Intravenous Given 11/02/20 1607)  sodium chloride 0.9 % bolus 1,000 mL (1,000 mLs Intravenous New Bag/Given 11/02/20 1812)  metoCLOPramide (REGLAN) injection 10 mg (10 mg Intravenous Given 11/02/20 1810)    ED Course  I have reviewed the triage vital signs and the nursing notes.  Pertinent labs & imaging results that were available during my care of the patient were reviewed by me and considered in my medical decision making (see chart for details).    MDM Rules/Calculators/A&P                          Patient here for evaluation of right-sided abdominal pain, nausea, vomiting and diarrhea.  No fever.  Vital signs show  tachycardia.  Patient denies chest pain or dyspnea.  No cardiac history.  He has tenderness of the right upper abdomen on exam no guarding or rebound tenderness.  No prior abdominal surgeries.  Exam concerning for possible gallbladder disease.  Ultrasound unavailable at this time.  We will proceed with CT abdomen pelvis.  On recheck, patient reported continued nausea.  No vomiting during the ED stay.  CT abdomen and pelvis without acute findings for patient's symptoms.  Labs interpreted by me, show no leukocytosis.  Mild hypokalemia which is likely secondary to his vomiting and diarrhea.  No significant electrolyte derangement.  Lipase unremarkable.  EKG without acute ischemic change.  He is receiving IV fluids, tachycardia still present but improving.  Source of patient's abdominal pain and tachycardia unclear at this time.  He is receiving a second liter of IV fluids and we will plan to check orthostatics upon completion of his fluids.  Discussed findings with Burgess Amor, PA-C at end of shift who agrees to reassess patient.  Plan will likely include discharge home if tachycardia improves and will schedule outpatient gallbladder ultrasound for tomorrow morning.  Final Clinical Impression(s) / ED Diagnoses Final diagnoses:  Right upper quadrant abdominal pain  Tachycardia    Rx / DC Orders ED Discharge Orders    None       Rosey Bath 11/02/20 Chancy Milroy, MD 11/02/20 2310

## 2020-11-02 NOTE — ED Notes (Signed)
Pt gone to CT 

## 2020-11-02 NOTE — ED Triage Notes (Signed)
Pt states he has been having intermittent n/v/d for the past 5 days; pt states he has been having diarrhea for every hour for the last 2 days; pt now c/o right lower abdominal pain that is described as sharp

## 2020-11-02 NOTE — ED Provider Notes (Signed)
Patient signed out to me from [place], PA-C at shift change.  Patient with type 2 DM and htn,  intermittent nausea vomiting and diarrhea for the past 5 days, lower abdominal pain with negative CT abdominal imaging with today's work-up.  He has received 2 L of normal saline and continues to be tachycardic and hypotensive with persistent weakness and remains orthostatic.  Suspect viral gastroenteritis.  Patient will require admission for dehydration and further IV fluids.   Of note, pt is on metformin and Rybelsis for DM, metoprolol for HTN.   Discussed with Dr. Thomes Dinning who accepts pt for admission.  Results for orders placed or performed during the hospital encounter of 11/02/20  Comprehensive metabolic panel  Result Value Ref Range   Sodium 136 135 - 145 mmol/L   Potassium 3.2 (L) 3.5 - 5.1 mmol/L   Chloride 103 98 - 111 mmol/L   CO2 23 22 - 32 mmol/L   Glucose, Bld 148 (H) 70 - 99 mg/dL   BUN 8 6 - 20 mg/dL   Creatinine, Ser 1.61 0.61 - 1.24 mg/dL   Calcium 8.6 (L) 8.9 - 10.3 mg/dL   Total Protein 7.4 6.5 - 8.1 g/dL   Albumin 3.8 3.5 - 5.0 g/dL   AST 13 (L) 15 - 41 U/L   ALT 23 0 - 44 U/L   Alkaline Phosphatase 70 38 - 126 U/L   Total Bilirubin 0.8 0.3 - 1.2 mg/dL   GFR, Estimated >09 >60 mL/min   Anion gap 10 5 - 15  Lipase, blood  Result Value Ref Range   Lipase 24 11 - 51 U/L  CBC with Differential  Result Value Ref Range   WBC 9.7 4.0 - 10.5 K/uL   RBC 6.06 (H) 4.22 - 5.81 MIL/uL   Hemoglobin 18.5 (H) 13.0 - 17.0 g/dL   HCT 45.4 (H) 09.8 - 11.9 %   MCV 90.6 80.0 - 100.0 fL   MCH 30.5 26.0 - 34.0 pg   MCHC 33.7 30.0 - 36.0 g/dL   RDW 14.7 82.9 - 56.2 %   Platelets 475 (H) 150 - 400 K/uL   nRBC 0.0 0.0 - 0.2 %   Neutrophils Relative % 71 %   Neutro Abs 6.9 1.7 - 7.7 K/uL   Lymphocytes Relative 15 %   Lymphs Abs 1.5 0.7 - 4.0 K/uL   Monocytes Relative 8 %   Monocytes Absolute 0.8 0.1 - 1.0 K/uL   Eosinophils Relative 4 %   Eosinophils Absolute 0.4 0.0 - 0.5  K/uL   Basophils Relative 1 %   Basophils Absolute 0.1 0.0 - 0.1 K/uL   Immature Granulocytes 1 %   Abs Immature Granulocytes 0.10 (H) 0.00 - 0.07 K/uL  Urinalysis, Routine w reflex microscopic Urine, Clean Catch  Result Value Ref Range   Color, Urine YELLOW YELLOW   APPearance HAZY (A) CLEAR   Specific Gravity, Urine 1.025 1.005 - 1.030   pH 5.0 5.0 - 8.0   Glucose, UA NEGATIVE NEGATIVE mg/dL   Hgb urine dipstick NEGATIVE NEGATIVE   Bilirubin Urine NEGATIVE NEGATIVE   Ketones, ur 20 (A) NEGATIVE mg/dL   Protein, ur 30 (A) NEGATIVE mg/dL   Nitrite NEGATIVE NEGATIVE   Leukocytes,Ua NEGATIVE NEGATIVE   RBC / HPF 0-5 0 - 5 RBC/hpf   WBC, UA 0-5 0 - 5 WBC/hpf   Bacteria, UA NONE SEEN NONE SEEN   Mucus PRESENT   POC CBG, ED  Result Value Ref Range   Glucose-Capillary 178 (H)  70 - 99 mg/dL  Troponin I (High Sensitivity)  Result Value Ref Range   Troponin I (High Sensitivity) 6 <18 ng/L  Troponin I (High Sensitivity)  Result Value Ref Range   Troponin I (High Sensitivity) 7 <18 ng/L   CT ABDOMEN PELVIS WO CONTRAST  Result Date: 11/02/2020 CLINICAL DATA:  Epigastric abdominal pain, nausea, vomiting. EXAM: CT ABDOMEN AND PELVIS WITHOUT CONTRAST TECHNIQUE: Multidetector CT imaging of the abdomen and pelvis was performed following the standard protocol without IV contrast. COMPARISON:  None. FINDINGS: Lower chest: No acute abnormality. Hepatobiliary: No gallstones or biliary dilatation is noted. Hepatic steatosis is noted. Pancreas: Unremarkable. No pancreatic ductal dilatation or surrounding inflammatory changes. Spleen: Normal in size without focal abnormality. Adrenals/Urinary Tract: Adrenal glands appear normal. Small nonobstructive right renal calculus is noted. No hydronephrosis or renal obstruction is noted. Urinary bladder is unremarkable. Stomach/Bowel: Stomach is within normal limits. Appendix appears normal. No evidence of bowel wall thickening, distention, or inflammatory changes.  Vascular/Lymphatic: No significant vascular findings are present. No enlarged abdominal or pelvic lymph nodes. Reproductive: Prostate is unremarkable. Other: Small fat containing right inguinal hernia is noted. No ascites is noted. Musculoskeletal: No acute or significant osseous findings. IMPRESSION: Hepatic steatosis. Small nonobstructive right renal calculus. No hydronephrosis or renal obstruction is noted. Small fat containing right inguinal hernia. Electronically Signed   By: Lupita Raider M.D.   On: 11/02/2020 15:59   DG Chest 2 View  Result Date: 11/02/2020 CLINICAL DATA:  Tachycardia, diarrhea EXAM: CHEST - 2 VIEW COMPARISON:  10/02/2007 chest radiograph. FINDINGS: Stable cardiomediastinal silhouette with normal heart size. No pneumothorax. No pleural effusion. Lungs appear clear, with no acute consolidative airspace disease and no pulmonary edema. IMPRESSION: No active cardiopulmonary disease. Electronically Signed   By: Delbert Phenix M.D.   On: 11/02/2020 17:21         Victoriano Lain 11/02/20 2144    Bethann Berkshire, MD 11/02/20 [phone]

## 2020-11-02 NOTE — ED Notes (Signed)
Pt would also like his left knee looked at because he thinks he twisted it on the way to the bathroom.

## 2020-11-02 NOTE — H&P (Incomplete)
History and Physical  Gavin Wilson DJM:[id number] DOB: 1980-11-19 DOA: 11/02/2020  Referring physician: ***  PCP: Benita Stabile, MD  Outpatient Specialists: *** Patient coming from: *** & is able to ambulate ***  Chief Complaint: ***   HPI: Gavin Wilson is a 40 y.o. male with medical history significant for *** (For level 3, the HPI must include 4+ descriptors: Location, Quality, Severity, Duration, Timing, Context, modifying factors, associated signs/symptoms and/or status of 3+ chronic problems.)   ED Course: ***  Review of Systems: Review of systems as noted in the HPI. All other systems reviewed and are negative.   Past Medical History:  Diagnosis Date  . Bell's palsy   . Diabetes mellitus without complication (HCC)   . Gout   . Hypercholesteremia   . Hypertension    Past Surgical History:  Procedure Laterality Date  . KNEE SURGERY      Social History:  reports that he has never smoked. He has never used smokeless tobacco. He reports that he does not drink alcohol and does not use drugs.   No Known Allergies  History reviewed. No pertinent family history.  ***  Prior to Admission medications   Medication Sig Start Date End Date Taking? Authorizing Provider  Azelastine-Fluticasone 137-50 MCG/ACT SUSP Place 1 spray into both nostrils daily. 08/31/17   Oretha Milch, MD  colchicine 0.6 MG tablet Take 0.6 mg by mouth as needed.    [provider]  levocetirizine (XYZAL) 5 MG tablet Take 5 mg by mouth every evening.    [provider]  sertraline (ZOLOFT) 25 MG tablet 100 mg.  05/28/17   [provider]    Physical Exam: BP 114/80   Pulse (!) 122   Temp 98.1 F (36.7 C) (Oral)   Resp 16   Ht 5\' 11"  (1.803 m)   Wt 130.6 kg   SpO2 95%   BMI 40.17 kg/m   . General: 40 y.o. year-old male well developed well nourished in no acute distress.  Alert and oriented x3. 45 HEENT: NCAT, EOMI . Neck: Supple, trachea  medial . Cardiovascular: Regular rate and rhythm with no rubs or gallops.  No thyromegaly or JVD noted.  No lower extremity edema. 2/4 pulses in all 4 extremities. Marland Kitchen Respiratory: Clear to auscultation with no wheezes or rales. Good inspiratory effort. . Abdomen: Soft, nontender nondistended with normal bowel sounds x4 quadrants. . Muskuloskeletal: No cyanosis, clubbing or edema noted bilaterally . Neuro: CN II-XII intact, strength 5/5 x 4, sensation, reflexes intact . Skin: No ulcerative lesions noted or rashes . Psychiatry: Judgement and insight appear normal. Mood is appropriate for condition and setting          Labs on Admission:  Basic Metabolic Panel: Recent Labs  Lab 11/02/20 1530  NA 136  K 3.2*  CL 103  CO2 23  GLUCOSE 148*  BUN 8  CREATININE 1.17  CALCIUM 8.6*   Liver Function Tests: Recent Labs  Lab 11/02/20 1530  AST 13*  ALT 23  ALKPHOS 70  BILITOT 0.8  PROT 7.4  ALBUMIN 3.8   Recent Labs  Lab 11/02/20 1530  LIPASE 24   No results for input(s): AMMONIA in the last 168 hours. CBC: Recent Labs  Lab 11/02/20 1530  WBC 9.7  NEUTROABS 6.9  HGB 18.5*  HCT 54.9*  MCV 90.6  PLT 475*   Cardiac Enzymes: No results for input(s): CKTOTAL, CKMB, CKMBINDEX, TROPONINI in the last 168 hours.  BNP (last  3 results) No results for input(s): BNP in the last 8760 hours.  ProBNP (last 3 results) No results for input(s): PROBNP in the last 8760 hours.  CBG: Recent Labs  Lab 11/02/20 1612  GLUCAP 178*    Radiological Exams on Admission: CT ABDOMEN PELVIS WO CONTRAST  Result Date: 11/02/2020 CLINICAL DATA:  Epigastric abdominal pain, nausea, vomiting. EXAM: CT ABDOMEN AND PELVIS WITHOUT CONTRAST TECHNIQUE: Multidetector CT imaging of the abdomen and pelvis was performed following the standard protocol without IV contrast. COMPARISON:  None. FINDINGS: Lower chest: No acute abnormality. Hepatobiliary: No gallstones or biliary dilatation is noted. Hepatic  steatosis is noted. Pancreas: Unremarkable. No pancreatic ductal dilatation or surrounding inflammatory changes. Spleen: Normal in size without focal abnormality. Adrenals/Urinary Tract: Adrenal glands appear normal. Small nonobstructive right renal calculus is noted. No hydronephrosis or renal obstruction is noted. Urinary bladder is unremarkable. Stomach/Bowel: Stomach is within normal limits. Appendix appears normal. No evidence of bowel wall thickening, distention, or inflammatory changes. Vascular/Lymphatic: No significant vascular findings are present. No enlarged abdominal or pelvic lymph nodes. Reproductive: Prostate is unremarkable. Other: Small fat containing right inguinal hernia is noted. No ascites is noted. Musculoskeletal: No acute or significant osseous findings. IMPRESSION: Hepatic steatosis. Small nonobstructive right renal calculus. No hydronephrosis or renal obstruction is noted. Small fat containing right inguinal hernia. Electronically Signed   By: Lupita Raider M.D.   On: 11/02/2020 15:59   DG Chest 2 View  Result Date: 11/02/2020 CLINICAL DATA:  Tachycardia, diarrhea EXAM: CHEST - 2 VIEW COMPARISON:  10/02/2007 chest radiograph. FINDINGS: Stable cardiomediastinal silhouette with normal heart size. No pneumothorax. No pleural effusion. Lungs appear clear, with no acute consolidative airspace disease and no pulmonary edema. IMPRESSION: No active cardiopulmonary disease. Electronically Signed   By: Delbert Phenix M.D.   On: 11/02/2020 17:21    EKG: I independently viewed the EKG done and my findings are as followed: ***   Assessment/Plan Present on Admission: . Acute diarrhea  Active Problems:   Acute diarrhea   DVT prophylaxis: ***   Code Status: ***   Family Communication: ***   Disposition Plan: ***   Consults called: ***   Admission status: ***     Frankey Shown MD [facility] Pager [phone]  If 7PM-7AM, please contact  night-coverage [email or web address] Password [facility]  11/02/2020, 9:33 PM

## 2020-11-03 DIAGNOSIS — E876 Hypokalemia: Secondary | ICD-10-CM

## 2020-11-03 DIAGNOSIS — M109 Gout, unspecified: Secondary | ICD-10-CM

## 2020-11-03 DIAGNOSIS — D75839 Thrombocytosis, unspecified: Secondary | ICD-10-CM

## 2020-11-03 DIAGNOSIS — R9431 Abnormal electrocardiogram [ECG] [EKG]: Secondary | ICD-10-CM

## 2020-11-03 DIAGNOSIS — E1165 Type 2 diabetes mellitus with hyperglycemia: Secondary | ICD-10-CM

## 2020-11-03 DIAGNOSIS — M25562 Pain in left knee: Secondary | ICD-10-CM

## 2020-11-03 DIAGNOSIS — R112 Nausea with vomiting, unspecified: Secondary | ICD-10-CM

## 2020-11-03 DIAGNOSIS — K760 Fatty (change of) liver, not elsewhere classified: Secondary | ICD-10-CM

## 2020-11-03 DIAGNOSIS — E785 Hyperlipidemia, unspecified: Secondary | ICD-10-CM

## 2020-11-03 DIAGNOSIS — K529 Noninfective gastroenteritis and colitis, unspecified: Principal | ICD-10-CM

## 2020-11-03 DIAGNOSIS — R109 Unspecified abdominal pain: Secondary | ICD-10-CM

## 2020-11-03 DIAGNOSIS — I10 Essential (primary) hypertension: Secondary | ICD-10-CM

## 2020-11-03 DIAGNOSIS — E860 Dehydration: Secondary | ICD-10-CM

## 2020-11-03 LAB — COMPREHENSIVE METABOLIC PANEL
ALT: 18 U/L (ref 0–44)
AST: 12 U/L — ABNORMAL LOW (ref 15–41)
Albumin: 3 g/dL — ABNORMAL LOW (ref 3.5–5.0)
Alkaline Phosphatase: 52 U/L (ref 38–126)
Anion gap: 9 (ref 5–15)
BUN: 11 mg/dL (ref 6–20)
CO2: 21 mmol/L — ABNORMAL LOW (ref 22–32)
Calcium: 7.7 mg/dL — ABNORMAL LOW (ref 8.9–10.3)
Chloride: 104 mmol/L (ref 98–111)
Creatinine, Ser: 1.32 mg/dL — ABNORMAL HIGH (ref 0.61–1.24)
GFR, Estimated: >60 mL/min (ref >60)
Glucose, Bld: 144 mg/dL — ABNORMAL HIGH (ref 70–99)
Potassium: 3.2 mmol/L — ABNORMAL LOW (ref 3.5–5.1)
Sodium: 134 mmol/L — ABNORMAL LOW (ref 135–145)
Total Bilirubin: 0.8 mg/dL (ref 0.3–1.2)
Total Protein: 5.9 g/dL — ABNORMAL LOW (ref 6.5–8.1)

## 2020-11-03 LAB — CBC
HCT: 48.5 % (ref 39.0–52.0)
Hemoglobin: 16.4 g/dL (ref 13.0–17.0)
MCH: 30.7 pg (ref 26.0–34.0)
MCHC: 33.8 g/dL (ref 30.0–36.0)
MCV: 90.7 fL (ref 80.0–100.0)
Platelets: 464 10*3/uL — ABNORMAL HIGH (ref 150–400)
RBC: 5.35 MIL/uL (ref 4.22–5.81)
RDW: 14 % (ref 11.5–15.5)
WBC: 12.9 10*3/uL — ABNORMAL HIGH (ref 4.0–10.5)
nRBC: 0 % (ref 0.0–0.2)

## 2020-11-03 LAB — PROTIME-INR
INR: 1.2 (ref 0.8–1.2)
Prothrombin Time: 15.4 seconds — ABNORMAL HIGH (ref 11.4–15.2)

## 2020-11-03 LAB — PHOSPHORUS: Phosphorus: 2.8 mg/dL (ref 2.5–4.6)

## 2020-11-03 LAB — HEMOGLOBIN A1C
Hgb A1c MFr Bld: 6 % — ABNORMAL HIGH (ref 4.8–5.6)
Mean Plasma Glucose: 125.5 mg/dL

## 2020-11-03 LAB — HIV ANTIBODY (ROUTINE TESTING W REFLEX): HIV Screen 4th Generation wRfx: NONREACTIVE

## 2020-11-03 LAB — C DIFFICILE QUICK SCREEN W PCR REFLEX
C Diff antigen: NEGATIVE
C Diff interpretation: NOT DETECTED
C Diff toxin: NEGATIVE

## 2020-11-03 LAB — GLUCOSE, CAPILLARY
Glucose-Capillary: 124 mg/dL — ABNORMAL HIGH (ref 70–99)
Glucose-Capillary: 117 mg/dL — ABNORMAL HIGH (ref 70–99)
Glucose-Capillary: 127 mg/dL — ABNORMAL HIGH (ref 70–99)

## 2020-11-03 LAB — MAGNESIUM: Magnesium: 1.3 mg/dL — ABNORMAL LOW (ref 1.7–2.4)

## 2020-11-03 LAB — APTT: aPTT: 28 seconds (ref 24–36)

## 2020-11-03 LAB — SARS CORONAVIRUS 2 (TAT 6-24 HRS): SARS Coronavirus 2: NEGATIVE

## 2020-11-03 MED ORDER — INSULIN ASPART 100 UNIT/ML IJ SOLN
0.0000 [IU] | Freq: Three times a day (TID) | INTRAMUSCULAR | Status: DC
  Administered 2020-11-03 – 2020-11-04 (×2): 1 [IU] via SUBCUTANEOUS

## 2020-11-03 MED ORDER — HYDROMORPHONE HCL 1 MG/ML IJ SOLN
0.5000 mg | INTRAMUSCULAR | Status: DC | PRN
Start: 2020-11-03 — End: 2020-11-04
  Administered 2020-11-03: 0.5 mg via INTRAVENOUS
  Filled 2020-11-03: qty 0.5

## 2020-11-03 MED ORDER — MAGNESIUM SULFATE 2 GM/50ML IV SOLN
2.0000 g | Freq: Once | INTRAVENOUS | Status: AC
  Administered 2020-11-03: 2 g via INTRAVENOUS
  Filled 2020-11-03: qty 50

## 2020-11-03 MED ORDER — PROCHLORPERAZINE EDISYLATE 10 MG/2ML IJ SOLN: 10.0000 mg | Freq: Four times a day (QID) | INTRAMUSCULAR | Status: DC | PRN

## 2020-11-03 MED ORDER — ACETAMINOPHEN 325 MG PO TABS: 650.0000 mg | ORAL_TABLET | Freq: Four times a day (QID) | ORAL | Status: DC | PRN

## 2020-11-03 MED ORDER — POTASSIUM CHLORIDE IN NACL 20-0.9 MEQ/L-% IV SOLN: INTRAVENOUS | Status: DC

## 2020-11-03 MED ORDER — POTASSIUM CHLORIDE 10 MEQ/100ML IV SOLN
10.0000 meq | INTRAVENOUS | Status: AC
  Administered 2020-11-03 (×3): 10 meq via INTRAVENOUS
  Filled 2020-11-03 (×3): qty 100

## 2020-11-03 NOTE — H&P (Signed)
History and Physical  Gavin Wilson VEL:[id number] DOB: 1980-10-12 DOA: 11/02/2020  Referring physician: Burgess Amor, PA-C PCP: Benita Stabile, MD  Patient coming from: Home  Chief Complaint: Diarrhea  HPI: Gavin Wilson is a 40 y.o. male with medical history significant for OSA on CPAP, left knee pain who presents to the emergency department due to 5-day onset of right lower quadrant abdominal pain and diarrhea.  Patient states that he ate same meal with his boss on Monday (5/16) and has same meal at dinner with his wife, but on Tuesday, he felt sick with loose watery stools which has worsened since onset with an average of 10-12 episodes daily (boss and wife were not sick), this was associated with nausea with initial vomiting which has since changed to dry heaves.  Abdominal pain was of moderate intensity with no known alleviating factor (other than medication given in the ED) or aggravating factor.  He denies fever, chills, chest pain, shortness of breath.  ED Course:  In the emergency department, he was tachycardic, but other vital signs were within normal range.  Work-up in the ED showed hemoconcentration with H/H of 18.5/54.9 and elevated platelet level at 475.  BMP was normal except for hypokalemia and hyperglycemia.  Troponin x2- 6 > 7, urinalysis was unimpressive for UTI, lipase 24. Chest x-ray showed no active cardiopulmonary disease CT abdomen pelvis without contrast showed hepatic steatosis, small nonobstructive right renal calculus with no hydronephrosis or renal obstruction. Pain medication were given, Reglan and Zofran were given, IV hydration was provided.  Hospitalist was asked admit him for further evaluation and management.  Review of Systems: Constitutional: Negative for chills and fever.  HENT: Negative for ear pain and sore throat.   Eyes: Negative for pain and visual disturbance.  Respiratory: Negative for cough, chest tightness and shortness of breath.    Cardiovascular: Negative for chest pain and palpitations.  Gastrointestinal: Positive for abdominal pain or nausea, diarrhea and vomiting.  Endocrine: Negative for polyphagia and polyuria.  Genitourinary: Negative for decreased urine volume, dysuria, enuresis Musculoskeletal: Positive for  left knee pain.  Negative for back pain.  Skin: Negative for color change and rash.  Allergic/Immunologic: Negative for immunocompromised state.  Neurological: Negative for tremors, syncope, speech difficulty and headaches.  Hematological: Does not bruise/bleed easily.  All other systems reviewed and are negative   Past Medical History:  Diagnosis Date  . Bell's palsy   . Diabetes mellitus without complication (HCC)   . Gout   . Hypercholesteremia   . Hypertension    Past Surgical History:  Procedure Laterality Date  . KNEE SURGERY      Social History:  reports that he has never smoked. He has never used smokeless tobacco. He reports that he does not drink alcohol and does not use drugs.   No Known Allergies  History reviewed. No pertinent family history.   Prior to Admission medications   Medication Sig Start Date End Date Taking? Authorizing Provider  Azelastine-Fluticasone 137-50 MCG/ACT SUSP Place 1 spray into both nostrils daily. 08/31/17   Oretha Milch, MD  colchicine 0.6 MG tablet Take 0.6 mg by mouth as needed.    [provider]  levocetirizine (XYZAL) 5 MG tablet Take 5 mg by mouth every evening.    [provider]  sertraline (ZOLOFT) 25 MG tablet 100 mg.  05/28/17   [provider]    Physical Exam: BP 98/61 (BP Location: Left Arm)   Pulse (!) 119  Temp 99.9 F (37.7 C) (Oral)   Resp 18   Ht 5\' 11"  (1.803 m)   Wt 130.6 kg   SpO2 94%   BMI 40.17 kg/m   . General: 40 y.o. year-old male obese and in no acute distress.  Alert and oriented x3. 45 HEENT: Dry mucous membrane.  NCAT, EOMI . Neck: Supple, trachea medial . Cardiovascular:  Tachycardia regular rate and rhythm with no rubs or gallops.  No thyromegaly or JVD noted.  No lower extremity edema. 2/4 pulses in all 4 extremities. Marland Kitchen Respiratory: Clear to auscultation with no wheezes or rales. Good inspiratory effort. . Abdomen: Soft, tender to palpation of RLQ with no guarding.  Nondistended with normal bowel sounds x4 quadrants. . Muskuloskeletal: Left knee brace noted.  No cyanosis, clubbing or edema noted bilaterally . Neuro: CN II-XII intact, strength 5/5 x 4, sensation, reflexes intact . Skin: No ulcerative lesions noted or rashes . Psychiatry: Judgement and insight appear normal. Mood is appropriate for condition and setting          Labs on Admission:  Basic Metabolic Panel: Recent Labs  Lab 11/02/20 1530  NA 136  K 3.2*  CL 103  CO2 23  GLUCOSE 148*  BUN 8  CREATININE 1.17  CALCIUM 8.6*   Liver Function Tests: Recent Labs  Lab 11/02/20 1530  AST 13*  ALT 23  ALKPHOS 70  BILITOT 0.8  PROT 7.4  ALBUMIN 3.8   Recent Labs  Lab 11/02/20 1530  LIPASE 24   No results for input(s): AMMONIA in the last 168 hours. CBC: Recent Labs  Lab 11/02/20 1530  WBC 9.7  NEUTROABS 6.9  HGB 18.5*  HCT 54.9*  MCV 90.6  PLT 475*   Cardiac Enzymes: No results for input(s): CKTOTAL, CKMB, CKMBINDEX, TROPONINI in the last 168 hours.  BNP (last 3 results) No results for input(s): BNP in the last 8760 hours.  ProBNP (last 3 results) No results for input(s): PROBNP in the last 8760 hours.  CBG: Recent Labs  Lab 11/02/20 1612  GLUCAP 178*    Radiological Exams on Admission: CT ABDOMEN PELVIS WO CONTRAST  Result Date: 11/02/2020 CLINICAL DATA:  Epigastric abdominal pain, nausea, vomiting. EXAM: CT ABDOMEN AND PELVIS WITHOUT CONTRAST TECHNIQUE: Multidetector CT imaging of the abdomen and pelvis was performed following the standard protocol without IV contrast. COMPARISON:  None. FINDINGS: Lower chest: No acute abnormality. Hepatobiliary: No  gallstones or biliary dilatation is noted. Hepatic steatosis is noted. Pancreas: Unremarkable. No pancreatic ductal dilatation or surrounding inflammatory changes. Spleen: Normal in size without focal abnormality. Adrenals/Urinary Tract: Adrenal glands appear normal. Small nonobstructive right renal calculus is noted. No hydronephrosis or renal obstruction is noted. Urinary bladder is unremarkable. Stomach/Bowel: Stomach is within normal limits. Appendix appears normal. No evidence of bowel wall thickening, distention, or inflammatory changes. Vascular/Lymphatic: No significant vascular findings are present. No enlarged abdominal or pelvic lymph nodes. Reproductive: Prostate is unremarkable. Other: Small fat containing right inguinal hernia is noted. No ascites is noted. Musculoskeletal: No acute or significant osseous findings. IMPRESSION: Hepatic steatosis. Small nonobstructive right renal calculus. No hydronephrosis or renal obstruction is noted. Small fat containing right inguinal hernia. Electronically Signed   By: 11/04/2020 M.D.   On: 11/02/2020 15:59   DG Chest 2 View  Result Date: 11/02/2020 CLINICAL DATA:  Tachycardia, diarrhea EXAM: CHEST - 2 VIEW COMPARISON:  10/02/2007 chest radiograph. FINDINGS: Stable cardiomediastinal silhouette with normal heart size. No pneumothorax. No pleural effusion. Lungs appear clear,  with no acute consolidative airspace disease and no pulmonary edema. IMPRESSION: No active cardiopulmonary disease. Electronically Signed   By: Delbert Phenix M.D.   On: 11/02/2020 17:21    EKG: I independently viewed the EKG done and my findings are as followed: Sinus tachycardia at rate of 121 with prolonged QTc (511 ms)  Assessment/Plan Present on Admission: . Acute diarrhea . OSA (obstructive sleep apnea)  Principal Problem:   Acute diarrhea Active Problems:   OSA (obstructive sleep apnea)   Left knee pain   Abdominal pain   Nausea & vomiting   Hypokalemia    Hyperglycemia due to diabetes mellitus (HCC)   Hepatic steatosis   Obesity, Class III, BMI 40-49.9 (morbid obesity) (HCC)   Prolonged QT interval   Essential hypertension   Hyperlipidemia   Gout  Abdominal pain, nausea, vomiting and diarrhea This may be due to viral gastroenteritis Continue IV NS at 100 mLs/Hr Continue IV Dilaudid 4 mg q.4h p.r.n. for moderate to severe pain Continue IV Compazine p.r.n. Continue clear liquid diet with plan to advanced diet as tolerated C. Diff and GI stool panel pending  Dehydration  Continue IV hydration  Thrombocytosis/elevated H/H H/H18.5/54.9, platelets 475 This may be due to hemoconcentration in the setting of dehydration due to diarrhea Continue IV hydration and continue to monitor CBC with morning labs  Prolonged QTc (511 ms) Avoid QT prolonging drugs Magnesium level will be checked Repeat EKG in the morning  Hepatic steatosis Stable, incidental finding on CT abdomen pelvis  Hypokalemia K+ 3.2, this will be replenished  Hyperglycemia secondary to T2DM CBG 148, no home medication on med rec Hemoglobin A1c will be checked  Left knee pain Continue Tylenol as needed Continue PT/OT eval and treat  Obstructive sleep apnea on CPAP Continue CPAP  Essential hypertension (uncontrolled) No home meds on med rec, we shall await updated med rec  Hyperlipidemia No home meds on med rec, we shall await med rec  Gout Home colchicine was only as needed Patient is currently without any gouty attack  DVT prophylaxis: Lovenox  Code Status: Full code  Family Communication: None at bedside  Disposition Plan:  Patient is from:                        home Anticipated DC to:                   SNF or family members home Anticipated DC date:               2-3 days Anticipated DC barriers:           Patient requires inpatient management for acute diarrhea, nausea, vomiting and abdominal pain   Consults called: None  Admission status:  Inpatient    Frankey Shown MD [facility]  11/03/2020, 1:33 AM

## 2020-11-03 NOTE — Progress Notes (Signed)
PROGRESS NOTE  Gavin Wilson JAS:[id number] DOB: [birth year] DOA: 11/02/2020 PCP: Benita Stabile, MD  Brief History:  40 year old male with history of hypertension, diabetes mellitus, and OSA presenting with 4-day history of diarrhea and abdominal pain.  The patient ate some [place] for lunch on 10/28/2020 and had the same thing for dinner on the same day.  On 10/29/2020, he began having diarrhea that gradually worsened over the next 2 to 3 days.  He had some lower abdominal pain with arthralgias in his knees and ankles.  There is no hematochezia or melena.  He denies any dysuria, hematuria, hematemesis.  He has some nausea.  He states that his coworker and spouse had food from the same establishment without any problems.  He denied fevers, chills, chest pain, shortness of breath, cough, hemoptysis, headache, neck pain.  Because of continued diarrhea and abdominal pain, he presented for further evaluation.  He denies any recent antibiotics. In the emergency department, the patient had low-grade temperature 99.9 F.  He was tachycardic up to 127.  He was hemodynamically stable albeit with soft blood pressure with oxygen saturation 95% on room air.  CT of the abdomen and pelvis was revealed only hepatic steatosis without any other acute findings.  Assessment/Plan: Acute gastroenteritis -Patient continues to have diarrhea although it appears to be slowing down -C. difficile negative -Awaiting stool pathogen panel results -Loperamide as needed -11/02/2020 CT abdomen/pelvis--essentially unremarkable for acute findings -Continue IV fluids -Advance diet  Hyponatremia -Secondary to volume depletion -Continue IV fluids  Hypokalemia/hypomagnesemia -Replete  Left knee pain -Secondary to reactive arthritis from the patient's diarrheal illness  Diabetes mellitus type 2 -NovoLog sliding scale -Hemoglobin A1c -Holding metformin and Rybelsus  Thrombocytosis -Likely acute phase  reactant  Leukemoid reaction -Personally reviewed chest x-ray--no consolidation -UA negative for pyuria -Likely stress demargination from the patient's gastroenteritis -Monitor CBC  Morbid obesity -BMI 40.17 -Lifestyle modification      Status is: Inpatient  Remains inpatient appropriate because:IV treatments appropriate due to intensity of illness or inability to take PO   Dispo: The patient is from: Home              Anticipated d/c is to: Home              Patient currently is not medically stable to d/c.   Difficult to place patient No        Family Communication:   No Family at bedside  Consultants:  none  Code Status:  FULL   DVT Prophylaxis:  [place] Lovenox   Procedures: As Listed in Progress Note Above  Antibiotics: None       Subjective: Patient continues to have diarrhea but states that it is slowing down.  He states that his abdominal pain is improving.  He complains of left knee pain but states that this is slowly improving.  He denies any recent injuries or trauma.  He denies any fevers, chills, headache, neck pain, chest pain, shortness breath, coughing, hemoptysis.  There is no dysuria or hematuria.  Objective: Vitals:   11/02/20 2252 11/03/20 0036 11/03/20 0237 11/03/20 0446  BP:  98/61 114/71 113/75  Pulse:  (!) 119 (!) 112 (!) 108  Resp:  18 19 19   Temp:  99.9 F (37.7 C) 99 F (37.2 C) 98.4 F (36.9 C)  TempSrc:  Oral Oral Oral  SpO2: 97% 94% 95% 96%  Weight:      Height:  Intake/Output Summary (Last 24 hours) at 11/03/2020 0908 Last data filed at 11/03/2020 0322 Gross per 24 hour  Intake 2820.59 ml  Output --  Net 2820.59 ml   Weight change:  Exam:   General:  Pt is alert, follows commands appropriately, not in acute distress  HEENT: No icterus, No thrush, No neck mass, [place]/AT  Cardiovascular: RRR, S1/S2, no rubs, no gallops  Respiratory: CTA bilaterally, no wheezing, no crackles, no rhonchi  Abdomen:  Soft/+BS, non tender, non distended, no guarding  Extremities: No edema, No lymphangitis, No petechiae, No rashes, no synovitis; mild pain to left knee with active flexion and extension   Data Reviewed: I have personally reviewed following labs and imaging studies Basic Metabolic Panel: Recent Labs  Lab 11/02/20 1530 11/03/20 0419  NA 136 134*  K 3.2* 3.2*  CL 103 104  CO2 23 21*  GLUCOSE 148* 144*  BUN 8 11  CREATININE 1.17 1.32*  CALCIUM 8.6* 7.7*  MG  --  1.3*  PHOS  --  2.8   Liver Function Tests: Recent Labs  Lab 11/02/20 1530 11/03/20 0419  AST 13* 12*  ALT 23 18  ALKPHOS 70 52  BILITOT 0.8 0.8  PROT 7.4 5.9*  ALBUMIN 3.8 3.0*   Recent Labs  Lab 11/02/20 1530  LIPASE 24   No results for input(s): AMMONIA in the last 168 hours. Coagulation Profile: Recent Labs  Lab 11/03/20 0419  INR 1.2   CBC: Recent Labs  Lab 11/02/20 1530 11/03/20 0419  WBC 9.7 12.9*  NEUTROABS 6.9  --   HGB 18.5* 16.4  HCT 54.9* 48.5  MCV 90.6 90.7  PLT 475* 464*   Cardiac Enzymes: No results for input(s): CKTOTAL, CKMB, CKMBINDEX, TROPONINI in the last 168 hours. BNP: Invalid input(s): POCBNP CBG: Recent Labs  Lab 11/02/20 1612  GLUCAP 178*   HbA1C: No results for input(s): HGBA1C in the last 72 hours. Urine analysis:    Component Value Date/Time   COLORURINE YELLOW 11/02/2020 2001   APPEARANCEUR HAZY (A) 11/02/2020 2001   LABSPEC 1.025 11/02/2020 2001   PHURINE 5.0 11/02/2020 2001   GLUCOSEU NEGATIVE 11/02/2020 2001   HGBUR NEGATIVE 11/02/2020 2001   BILIRUBINUR NEGATIVE 11/02/2020 2001   KETONESUR 20 (A) 11/02/2020 2001   PROTEINUR 30 (A) 11/02/2020 2001   UROBILINOGEN 0.2 12/05/2009 1547   NITRITE NEGATIVE 11/02/2020 2001   LEUKOCYTESUR NEGATIVE 11/02/2020 2001   Sepsis Labs: @LABRCNTIP (procalcitonin:4,lacticidven:4) ) Recent Results (from the past 240 hour(s))  C Difficile Quick Screen w PCR reflex     Status: None   Collection Time: 11/03/20  12:34 AM   Specimen: STOOL  Result Value Ref Range Status   C Diff antigen NEGATIVE NEGATIVE Final   C Diff toxin NEGATIVE NEGATIVE Final   C Diff interpretation No C. difficile detected.  Final    Comment: Performed at [facility], [address] [place], [place]     Scheduled Meds: . enoxaparin (LOVENOX) injection  40 mg Subcutaneous Q24H  . feeding supplement  1 Container Oral TID BM   Continuous Infusions:  Procedures/Studies: CT ABDOMEN PELVIS WO CONTRAST  Result Date: 11/02/2020 CLINICAL DATA:  Epigastric abdominal pain, nausea, vomiting. EXAM: CT ABDOMEN AND PELVIS WITHOUT CONTRAST TECHNIQUE: Multidetector CT imaging of the abdomen and pelvis was performed following the standard protocol without IV contrast. COMPARISON:  None. FINDINGS: Lower chest: No acute abnormality. Hepatobiliary: No gallstones or biliary dilatation is noted. Hepatic steatosis is noted. Pancreas: Unremarkable. No pancreatic ductal dilatation or  surrounding inflammatory changes. Spleen: Normal in size without focal abnormality. Adrenals/Urinary Tract: Adrenal glands appear normal. Small nonobstructive right renal calculus is noted. No hydronephrosis or renal obstruction is noted. Urinary bladder is unremarkable. Stomach/Bowel: Stomach is within normal limits. Appendix appears normal. No evidence of bowel wall thickening, distention, or inflammatory changes. Vascular/Lymphatic: No significant vascular findings are present. No enlarged abdominal or pelvic lymph nodes. Reproductive: Prostate is unremarkable. Other: Small fat containing right inguinal hernia is noted. No ascites is noted. Musculoskeletal: No acute or significant osseous findings. IMPRESSION: Hepatic steatosis. Small nonobstructive right renal calculus. No hydronephrosis or renal obstruction is noted. Small fat containing right inguinal hernia. Electronically Signed   By: Lupita Raider M.D.   On: 11/02/2020 15:59   DG Chest 2 View  Result  Date: 11/02/2020 CLINICAL DATA:  Tachycardia, diarrhea EXAM: CHEST - 2 VIEW COMPARISON:  10/02/2007 chest radiograph. FINDINGS: Stable cardiomediastinal silhouette with normal heart size. No pneumothorax. No pleural effusion. Lungs appear clear, with no acute consolidative airspace disease and no pulmonary edema. IMPRESSION: No active cardiopulmonary disease. Electronically Signed   By: Delbert Phenix M.D.   On: 11/02/2020 17:21    Catarina Hartshorn, DO  [facility]  If 7PM-7AM, please contact night-coverage [email or web address] Password [email or web address] 11/03/2020, 9:08 AM   LOS: 1 day

## 2020-11-04 DIAGNOSIS — N179 Acute kidney failure, unspecified: Secondary | ICD-10-CM

## 2020-11-04 LAB — GASTROINTESTINAL PANEL BY PCR, STOOL (REPLACES STOOL CULTURE)

## 2020-11-04 LAB — GLUCOSE, CAPILLARY
Glucose-Capillary: 180 mg/dL — ABNORMAL HIGH (ref 70–99)
Glucose-Capillary: 120 mg/dL — ABNORMAL HIGH (ref 70–99)
Glucose-Capillary: 140 mg/dL — ABNORMAL HIGH (ref 70–99)

## 2020-11-04 LAB — BASIC METABOLIC PANEL
Anion gap: 6 (ref 5–15)
BUN: 8 mg/dL (ref 6–20)
CO2: 21 mmol/L — ABNORMAL LOW (ref 22–32)
Calcium: 7.6 mg/dL — ABNORMAL LOW (ref 8.9–10.3)
Chloride: 108 mmol/L (ref 98–111)
Creatinine, Ser: 1.12 mg/dL (ref 0.61–1.24)
GFR, Estimated: >60 mL/min (ref >60)
Glucose, Bld: 125 mg/dL — ABNORMAL HIGH (ref 70–99)
Potassium: 3 mmol/L — ABNORMAL LOW (ref 3.5–5.1)
Sodium: 135 mmol/L (ref 135–145)

## 2020-11-04 LAB — CBC
HCT: 41.3 % (ref 39.0–52.0)
Hemoglobin: 14 g/dL (ref 13.0–17.0)
MCH: 30.9 pg (ref 26.0–34.0)
MCHC: 33.9 g/dL (ref 30.0–36.0)
MCV: 91.2 fL (ref 80.0–100.0)
Platelets: 393 10*3/uL (ref 150–400)
RBC: 4.53 MIL/uL (ref 4.22–5.81)
RDW: 14.2 % (ref 11.5–15.5)
WBC: 12.9 10*3/uL — ABNORMAL HIGH (ref 4.0–10.5)
nRBC: 0 % (ref 0.0–0.2)

## 2020-11-04 LAB — MAGNESIUM: Magnesium: 1.5 mg/dL — ABNORMAL LOW (ref 1.7–2.4)

## 2020-11-04 MED ORDER — POTASSIUM CHLORIDE CRYS ER 20 MEQ PO TBCR
40.0000 meq | EXTENDED_RELEASE_TABLET | Freq: Once | ORAL | Status: AC
  Administered 2020-11-04: 40 meq via ORAL
  Filled 2020-11-04: qty 2

## 2020-11-04 MED ORDER — HYDROCODONE-ACETAMINOPHEN 5-325 MG PO TABS: 1.0000 | ORAL_TABLET | Freq: Four times a day (QID) | ORAL | 0 refills | Status: AC | PRN

## 2020-11-04 MED ORDER — MAGNESIUM SULFATE 2 GM/50ML IV SOLN
2.0000 g | Freq: Once | INTRAVENOUS | Status: AC
  Administered 2020-11-04: 2 g via INTRAVENOUS
  Filled 2020-11-04: qty 50

## 2020-11-04 NOTE — Discharge Summary (Signed)
Physician Discharge Summary  Gavin Wilson ZOX:[id number] DOB: January 21, 1981 DOA: 11/02/2020  PCP: Benita Stabile, MD  Admit date: 11/02/2020 Discharge date: 11/04/2020  Admitted From: Home Disposition:  Home  Recommendations for Outpatient Follow-up:  1. Follow up with PCP in 1-2 weeks 2. Please obtain BMP/CBC in one week    Discharge Condition: Stable CODE STATUS: FULL Diet recommendation: Heart Healthy   Brief/Interim Summary: 40 year old male with history of hypertension, diabetes mellitus, and OSA presenting with 4-day history of diarrhea and abdominal pain.  The patient ate some [place] for lunch on 10/28/2020 and had the same thing for dinner on the same day.  On 10/29/2020, he began having diarrhea that gradually worsened over the next 2 to 3 days.  He had some lower abdominal pain with arthralgias in his knees and ankles.  There is no hematochezia or melena.  He denies any dysuria, hematuria, hematemesis.  He has some nausea.  He states that his coworker and spouse had food from the same establishment without any problems.  He denied fevers, chills, chest pain, shortness of breath, cough, hemoptysis, headache, neck pain.  Because of continued diarrhea and abdominal pain, he presented for further evaluation.  He denies any recent antibiotics. In the emergency department, the patient had low-grade temperature 99.9 F.  He was tachycardic up to 127.  He was hemodynamically stable albeit with soft blood pressure with oxygen saturation 95% on room air.  CT of the abdomen and pelvis was revealed only hepatic steatosis without any other acute findings.  Discharge Diagnoses:  Acute gastroenteritis -diarrhea slowing down and now BM with more consistency -C. difficile negative -Awaiting stool pathogen panel results--pending at time of d/c but patient is clinically improving -Loperamide as needed -11/02/2020 CT abdomen/pelvis--essentially unremarkable for acute findings -Continue IV  fluids -Advanced diet which patient tolerated  Hyponatremia -Secondary to volume depletion -Continue IV fluids -improved  Hypokalemia/hypomagnesemia -Repleted  Left knee pain -Secondary to reactive arthritis from the patient's diarrheal illness -overall stable/improving  Diabetes mellitus type 2 -NovoLog sliding scale -Hemoglobin A1c--6.0 -Holding metformin and Rybelsus  Thrombocytosis -Likely acute phase reactant -improved  Leukemoid reaction -Personally reviewed chest x-ray--no consolidation -UA negative for pyuria -Likely stress demargination  -Overall stable -remains afebrile and hemodynamically stable  Morbid obesity -BMI 40.17 -Lifestyle modification    Discharge Instructions   Allergies as of 11/04/2020   No Known Allergies     Medication List    STOP taking these medications   Azelastine-Fluticasone 137-50 MCG/ACT Susp   colchicine 0.6 MG tablet   levocetirizine 5 MG tablet Commonly known as: XYZAL     TAKE these medications   atorvastatin 10 MG tablet Commonly known as: LIPITOR Take 1 tablet by mouth daily.   loratadine 10 MG tablet Commonly known as: CLARITIN Take 10 mg by mouth daily.   metFORMIN 500 MG 24 hr tablet Commonly known as: GLUCOPHAGE-XR Take 500 mg by mouth daily.   metoprolol succinate 50 MG 24 hr tablet Commonly known as: TOPROL-XL Take 50 mg by mouth daily.   Rybelsus 7 MG Tabs Generic drug: Semaglutide Take 1 tablet by mouth daily.   sertraline 25 MG tablet Commonly known as: ZOLOFT 100 mg.       No Known Allergies  Consultations:  none   Procedures/Studies: CT ABDOMEN PELVIS WO CONTRAST  Result Date: 11/02/2020 CLINICAL DATA:  Epigastric abdominal pain, nausea, vomiting. EXAM: CT ABDOMEN AND PELVIS WITHOUT CONTRAST TECHNIQUE: Multidetector CT imaging of the abdomen and pelvis was performed following  the standard protocol without IV contrast. COMPARISON:  None. FINDINGS: Lower chest: No  acute abnormality. Hepatobiliary: No gallstones or biliary dilatation is noted. Hepatic steatosis is noted. Pancreas: Unremarkable. No pancreatic ductal dilatation or surrounding inflammatory changes. Spleen: Normal in size without focal abnormality. Adrenals/Urinary Tract: Adrenal glands appear normal. Small nonobstructive right renal calculus is noted. No hydronephrosis or renal obstruction is noted. Urinary bladder is unremarkable. Stomach/Bowel: Stomach is within normal limits. Appendix appears normal. No evidence of bowel wall thickening, distention, or inflammatory changes. Vascular/Lymphatic: No significant vascular findings are present. No enlarged abdominal or pelvic lymph nodes. Reproductive: Prostate is unremarkable. Other: Small fat containing right inguinal hernia is noted. No ascites is noted. Musculoskeletal: No acute or significant osseous findings. IMPRESSION: Hepatic steatosis. Small nonobstructive right renal calculus. No hydronephrosis or renal obstruction is noted. Small fat containing right inguinal hernia. Electronically Signed   By: Lupita Raider M.D.   On: 11/02/2020 15:59   DG Chest 2 View  Result Date: 11/02/2020 CLINICAL DATA:  Tachycardia, diarrhea EXAM: CHEST - 2 VIEW COMPARISON:  10/02/2007 chest radiograph. FINDINGS: Stable cardiomediastinal silhouette with normal heart size. No pneumothorax. No pleural effusion. Lungs appear clear, with no acute consolidative airspace disease and no pulmonary edema. IMPRESSION: No active cardiopulmonary disease. Electronically Signed   By: Delbert Phenix M.D.   On: 11/02/2020 17:21        Discharge Exam: Vitals:   11/03/20 2158 11/04/20 0554  BP: 109/64 133/82  Pulse: 97 95  Resp: 18 16  Temp: 98.1 F (36.7 C) 98.4 F (36.9 C)  SpO2: 98% 95%   Vitals:   11/03/20 1227 11/03/20 1404 11/03/20 2158 11/04/20 0554  BP: 112/84 (!) 141/88 109/64 133/82  Pulse: 98 (!) 104 97 95  Resp: 18 18 18 16   Temp: 97.9 F (36.6 C) 98.5 F  (36.9 C) 98.1 F (36.7 C) 98.4 F (36.9 C)  TempSrc: Oral Oral Oral Oral  SpO2: 96% 97% 98% 95%  Weight:      Height:        General: Pt is alert, awake, not in acute distress Cardiovascular: RRR, S1/S2 +, no rubs, no gallops Respiratory: CTA bilaterally, no wheezing, no rhonchi Abdominal: Soft, NT, ND, bowel sounds + Extremities: no edema, no cyanosis;  Left knee without erythema, edema, crepitance   The results of significant diagnostics from this hospitalization (including imaging, microbiology, ancillary and laboratory) are listed below for reference.    Significant Diagnostic Studies: CT ABDOMEN PELVIS WO CONTRAST  Result Date: 11/02/2020 CLINICAL DATA:  Epigastric abdominal pain, nausea, vomiting. EXAM: CT ABDOMEN AND PELVIS WITHOUT CONTRAST TECHNIQUE: Multidetector CT imaging of the abdomen and pelvis was performed following the standard protocol without IV contrast. COMPARISON:  None. FINDINGS: Lower chest: No acute abnormality. Hepatobiliary: No gallstones or biliary dilatation is noted. Hepatic steatosis is noted. Pancreas: Unremarkable. No pancreatic ductal dilatation or surrounding inflammatory changes. Spleen: Normal in size without focal abnormality. Adrenals/Urinary Tract: Adrenal glands appear normal. Small nonobstructive right renal calculus is noted. No hydronephrosis or renal obstruction is noted. Urinary bladder is unremarkable. Stomach/Bowel: Stomach is within normal limits. Appendix appears normal. No evidence of bowel wall thickening, distention, or inflammatory changes. Vascular/Lymphatic: No significant vascular findings are present. No enlarged abdominal or pelvic lymph nodes. Reproductive: Prostate is unremarkable. Other: Small fat containing right inguinal hernia is noted. No ascites is noted. Musculoskeletal: No acute or significant osseous findings. IMPRESSION: Hepatic steatosis. Small nonobstructive right renal calculus. No hydronephrosis or renal obstruction is  noted.  Small fat containing right inguinal hernia. Electronically Signed   By: Lupita RaiderJames  Green Jr M.D.   On: 11/02/2020 15:59   DG Chest 2 View  Result Date: 11/02/2020 CLINICAL DATA:  Tachycardia, diarrhea EXAM: CHEST - 2 VIEW COMPARISON:  10/02/2007 chest radiograph. FINDINGS: Stable cardiomediastinal silhouette with normal heart size. No pneumothorax. No pleural effusion. Lungs appear clear, with no acute consolidative airspace disease and no pulmonary edema. IMPRESSION: No active cardiopulmonary disease. Electronically Signed   By: Delbert PhenixJason A Poff M.D.   On: 11/02/2020 17:21     Microbiology: Recent Results (from the past 240 hour(s))  SARS CORONAVIRUS 2 (Jacson [place] 6-24 HRS) Nasopharyngeal Nasopharyngeal Swab     Status: None   Collection Time: 11/02/20  9:32 PM   Specimen: Nasopharyngeal Swab  Result Value Ref Range Status   SARS Coronavirus 2 NEGATIVE NEGATIVE Final    Comment: (NOTE) SARS-CoV-2 target nucleic acids are NOT DETECTED.  The SARS-CoV-2 RNA is generally detectable in upper and lower respiratory specimens during the acute phase of infection. Negative results do not preclude SARS-CoV-2 infection, do not rule out co-infections with other pathogens, and should not be used as the sole basis for treatment or other patient management decisions. Negative results must be combined with clinical observations, patient history, and epidemiological information. The expected result is Negative.  Fact Sheet for Patients: HairSlick.nohttps://www.fda.gov/media/138098/download  Fact Sheet for Healthcare Providers: quierodirigir.comhttps://www.fda.gov/media/138095/download  This test is not yet approved or cleared by the [place] FDA and  has been authorized for detection and/or diagnosis of SARS-CoV-2 by FDA under an Emergency Use Authorization (EUA). This EUA will remain  in effect (meaning this test can be used) for the duration of the COVID-19 declaration under Se ction 564(b)(1) of the Act, 21 U.S.C. section  360bbb-3(b)(1), unless the authorization is terminated or revoked sooner.  Performed at [facility], [address] [place], [place] [address]   C Difficile Quick Screen w PCR reflex     Status: None   Collection Time: 11/03/20 12:34 AM   Specimen: STOOL  Result Value Ref Range Status   C Diff antigen NEGATIVE NEGATIVE Final   C Diff toxin NEGATIVE NEGATIVE Final   C Diff interpretation No C. difficile detected.  Final    Comment: Performed at [facility], [address] [place], [place] [address]     Labs: Basic Metabolic Panel: Recent Labs  Lab 11/02/20 1530 11/03/20 0419 11/04/20 0321  NA 136 134* 135  K 3.2* 3.2* 3.0*  CL 103 104 108  CO2 23 21* 21*  GLUCOSE 148* 144* 125*  BUN 8 11 8   CREATININE 1.17 1.32* 1.12  CALCIUM 8.6* 7.7* 7.6*  MG  --  1.3* 1.5*  PHOS  --  2.8  --    Liver Function Tests: Recent Labs  Lab 11/02/20 1530 11/03/20 0419  AST 13* 12*  ALT 23 18  ALKPHOS 70 52  BILITOT 0.8 0.8  PROT 7.4 5.9*  ALBUMIN 3.8 3.0*   Recent Labs  Lab 11/02/20 1530  LIPASE 24   No results for input(s): AMMONIA in the last 168 hours. CBC: Recent Labs  Lab 11/02/20 1530 11/03/20 0419 11/04/20 0321  WBC 9.7 12.9* 12.9*  NEUTROABS 6.9  --   --   HGB 18.5* 16.4 14.0  HCT 54.9* 48.5 41.3  MCV 90.6 90.7 91.2  PLT 475* 464* 393   Cardiac Enzymes: No results for input(s): CKTOTAL, CKMB, CKMBINDEX, TROPONINI in the last 168 hours. BNP: Invalid input(s): POCBNP  CBG: Recent Labs  Lab 11/02/20 2238 11/03/20 1217 11/03/20 1614 11/03/20 2200 11/04/20 0750  GLUCAP 180* 124* 117* 127* 120*    Time coordinating discharge:  36 minutes  Signed:  Catarina Hartshorn, DO [facility] Pager: [phone] 11/04/2020, 10:30 AM

## 2020-11-04 NOTE — Progress Notes (Signed)
Nsg Discharge Note  Admit Date:  11/02/2020 Discharge date: 11/04/2020   Gavin Wilson to be D/C'd Home  per MD order.  AVS completed.  Copy for chart, and copy for patient signed, and dated. Patient/caregiver able to verbalize understanding.  Discharge Medication: Allergies as of 11/04/2020   No Known Allergies     Medication List    STOP taking these medications   Azelastine-Fluticasone 137-50 MCG/ACT Susp   colchicine 0.6 MG tablet   levocetirizine 5 MG tablet Commonly known as: XYZAL     TAKE these medications   atorvastatin 10 MG tablet Commonly known as: LIPITOR Take 1 tablet by mouth daily.   HYDROcodone-acetaminophen 5-325 MG tablet Commonly known as: Norco Take 1 tablet by mouth every 6 (six) hours as needed for moderate pain.   loratadine 10 MG tablet Commonly known as: CLARITIN Take 10 mg by mouth daily.   metFORMIN 500 MG 24 hr tablet Commonly known as: GLUCOPHAGE-XR Take 500 mg by mouth daily.   metoprolol succinate 50 MG 24 hr tablet Commonly known as: TOPROL-XL Take 50 mg by mouth daily.   Rybelsus 7 MG Tabs Generic drug: Semaglutide Take 1 tablet by mouth daily.   sertraline 25 MG tablet Commonly known as: ZOLOFT 100 mg.       Discharge Assessment: Vitals:   11/03/20 2158 11/04/20 0554  BP: 109/64 133/82  Pulse: 97 95  Resp: 18 16  Temp: 98.1 F (36.7 C) 98.4 F (36.9 C)  SpO2: 98% 95%   Skin clean, dry and intact without evidence of skin break down, no evidence of skin tears noted. IV catheter discontinued intact. Site without signs and symptoms of complications - no redness or edema noted at insertion site, patient denies c/o pain - only slight tenderness at site.  Dressing with slight pressure applied.  D/c Instructions-Education: Discharge instructions given to patient/family with verbalized understanding. D/c education completed with patient/family including follow up instructions, medication list, d/c activities limitations  if indicated, with other d/c instructions as indicated by MD - patient able to verbalize understanding, all questions fully answered. Patient instructed to return to ED, call 911, or call MD for any changes in condition.  Patient escorted via WC, and D/C home via private auto.  Demetrio Lapping, LPN 9/47/0962 8:36 PM

## 2020-11-04 NOTE — Evaluation (Signed)
Occupational Therapy Evaluation Patient Details Name: Gavin Wilson MRN: [id number] DOB: [birth year] Today's Date: 11/04/2020    History of Present Illness Gavin Wilson is a 40 y.o. male with medical history significant for OSA on CPAP, left knee pain who presents to the emergency department due to 5-day onset of right lower quadrant abdominal pain and diarrhea.  Patient states that he ate same meal with his boss on Monday (5/16) and has same meal at dinner with his wife, but on Tuesday, he felt sick with loose watery stools which has worsened since onset with an average of 10-12 episodes daily (boss and wife were not sick), this was associated with nausea with initial vomiting which has since changed to dry heaves.  Abdominal pain was of moderate intensity with no known alleviating factor (other than medication given in the ED) or aggravating factor.  He denies fever, chills, chest pain, shortness of breath.   Clinical Impression   Pt agreeable to OT/PT co-evaluation. Pt demonstrates increased pain in L knee limiting ability to dress the lower body. Assist needed to don L sock this date. Pt requires use of RW for ambulation and functional transfers. Mod I level of assist noted with use of RW. Pt would benefit from continued OT in the hospital to increased strength, balance, endurance, and to be educated on use of lower body dressing adaptive equipment for safe ADL's.     Follow Up Recommendations  No OT follow up    Equipment Recommendations  Other (comment) (sock aid, shoe horn, grabber)           Precautions / Restrictions Precautions Precautions: None Restrictions Weight Bearing Restrictions: No      Mobility Bed Mobility Overal bed mobility: Modified Independent             General bed mobility comments: somewhat labored movement    Transfers Overall transfer level: Modified independent Equipment used: Rolling walker (2 wheeled)             General  transfer comment: Requires use of RW for balance and to assist with management of pain in L LE during ambulation.    Balance Overall balance assessment: Needs assistance Sitting-balance support: Feet supported Sitting balance-Leahy Scale: Good Sitting balance - Comments: EOB   Standing balance support: Bilateral upper extremity supported;During functional activity Standing balance-Leahy Scale: Fair Standing balance comment: Limiting weight bearing on L LE. Use of RW.                           ADL either performed or assessed with clinical judgement   ADL Overall ADL's : Needs assistance/impaired                     Lower Body Dressing: Moderate assistance;Sitting/lateral leans Lower Body Dressing Details (indicate cue type and reason): Assit to don L sock due to L knee pain and decreased ROM. Toilet Transfer: Modified Chartered certified accountant Details (indicate cue type and reason): simulated via EOB to ambulating in hall and returning to EOB using RW.                 Vision Baseline Vision/History: No visual deficits Patient Visual Report: No change from baseline                  Pertinent Vitals/Pain Pain Assessment: Faces Faces Pain Scale: Hurts little more Pain Location: L knee with movement using RW Pain Descriptors / Indicators:  Grimacing;Other (Comment) (resting) Pain Intervention(s): Limited activity within patient's tolerance;Monitored during session;Repositioned     Hand Dominance Right   Extremity/Trunk Assessment Upper Extremity Assessment Upper Extremity Assessment: Overall WFL for tasks assessed   Lower Extremity Assessment Lower Extremity Assessment: Defer to PT evaluation   Cervical / Trunk Assessment Cervical / Trunk Assessment: Normal   Communication Communication Communication: No difficulties   Cognition Arousal/Alertness: Awake/alert Behavior During Therapy: WFL for tasks assessed/performed Overall  Cognitive Status: Within Functional Limits for tasks assessed                                                      Home Living Family/patient expects to be discharged to:: Private residence Living Arrangements: Spouse/significant other;Children Available Help at Discharge: Available PRN/intermittently;Family Type of Home: House Home Access: Stairs to enter [employer] of Steps: 5 Entrance Stairs-Rails: Left Home Layout: One level     Bathroom Shower/Tub: Walk-in shower;Tub/shower unit   Bathroom Toilet: Standard     Home Equipment: None          Prior Functioning/Environment Level of Independence: Independent        Comments: pt drove and worked.        OT Problem List: Decreased range of motion;Decreased activity tolerance;Impaired balance (sitting and/or standing);Pain      OT Treatment/Interventions: Self-care/ADL training;Therapeutic exercise;Therapeutic activities;DME and/or AE instruction;Balance training;Patient/family education    OT Goals(Current goals can be found in the care plan section) Acute Rehab OT Goals Patient Stated Goal: return home OT Goal Formulation: With patient Time For Goal Achievement: 11/18/20 Potential to Achieve Goals: Good  OT Frequency: Min 1X/week               Co-evaluation PT/OT/SLP Co-Evaluation/Treatment: Yes Reason for Co-Treatment: To address functional/ADL transfers   OT goals addressed during session: ADL's and self-care                       End of Session Equipment Utilized During Treatment: Rolling walker  Activity Tolerance: Patient tolerated treatment well Patient left: in bed;with call bell/phone within reach;with family/visitor present  OT Visit Diagnosis: Unsteadiness on feet (R26.81);Other abnormalities of gait and mobility (R26.89);Pain Pain - Right/Left: Left Pain - part of body: Knee                Time: 4315-4008 OT Time Calculation (min): 19  min Charges:  OT General Charges $OT Visit: 1 Visit OT Evaluation $OT Eval Low Complexity: 1 Low  Steen Bisig OT, MOT   Danie Chandler 11/04/2020, 10:10 AM

## 2020-11-04 NOTE — Progress Notes (Addendum)
PT recommended OPPT. TOC called the room, patient is independent and feels like he has knee pain due to arthritis and does not want OPPT at this time.

## 2020-11-04 NOTE — Plan of Care (Signed)
  Problem: Acute Rehab OT Goals (only OT should resolve) Goal: OT Additional ADL Goal #1 Flowsheets (Taken 11/04/2020 1014) Additional ADL Goal #1: Pt will be educated on AE used for lower body dressing ADL's.   Daney Moor OT, MOT

## 2020-11-04 NOTE — Evaluation (Signed)
Physical Therapy Evaluation Patient Details Name: Gavin Wilson MRN: [id number] DOB: [birth year] Today's Date: 11/04/2020   History of Present Illness  Gavin Wilson is a 40 y.o. male with medical history significant for OSA on CPAP, left knee pain who presents to the emergency department due to 5-day onset of right lower quadrant abdominal pain and diarrhea.  Patient states that he ate same meal with his boss on Monday (5/16) and has same meal at dinner with his wife, but on Tuesday, he felt sick with loose watery stools which has worsened since onset with an average of 10-12 episodes daily (boss and wife were not sick), this was associated with nausea with initial vomiting which has since changed to dry heaves.  Abdominal pain was of moderate intensity with no known alleviating factor (other than medication given in the ED) or aggravating factor.  He denies fever, chills, chest pain, shortness of breath.    Clinical Impression  Patient functioning near baseline for functional mobility and gait, required use of RW due to increased pain left knee when taking steps, demonstrates good return for use of RW and encouraged to ambulate ad lib for length of stay.  Plan:  Patient discharged from physical therapy to care of nursing for ambulation daily as tolerated for length of stay.     Follow Up Recommendations Outpatient PT    Equipment Recommendations  Rolling walker with 5" wheels    Recommendations for Other Services       Precautions / Restrictions Precautions Precautions: None Restrictions Weight Bearing Restrictions: No      Mobility  Bed Mobility Overal bed mobility: Modified Independent             General bed mobility comments: somewhat labored movement    Transfers Overall transfer level: Modified independent Equipment used: Rolling walker (2 wheeled)             General transfer comment: required use of RW due to left knee  pain  Ambulation/Gait Ambulation/Gait assistance: Modified independent (Device/Increase time) Gait Distance (Feet): 100 Feet Assistive device: Rolling walker (2 wheeled) Gait Pattern/deviations: Step-to pattern;Decreased step length - left;Decreased stance time - left;Decreased stride length;Antalgic Gait velocity: decreased   General Gait Details: slightly labored cadence with limited weightbearing on LLE due to increased pain, required use of RW with good carryover demonstrated, no loss of balance  Stairs            Wheelchair Mobility    Modified Rankin (Stroke Patients Only)       Balance Overall balance assessment: Needs assistance Sitting-balance support: Feet supported;No upper extremity supported Sitting balance-Leahy Scale: Good Sitting balance - Comments: EOB   Standing balance support: During functional activity;No upper extremity supported Standing balance-Leahy Scale: Poor Standing balance comment: fair/poor without AD, fair/good using RW                             Pertinent Vitals/Pain Pain Assessment: Faces Faces Pain Scale: Hurts little more Pain Location: L knee with movement using RW Pain Descriptors / Indicators: Grimacing;Sore Pain Intervention(s): Limited activity within patient's tolerance;Monitored during session;Repositioned    Home Living Family/patient expects to be discharged to:: Private residence Living Arrangements: Spouse/significant other;Children Available Help at Discharge: Available PRN/intermittently;Family Type of Home: House Home Access: Stairs to enter Entrance Stairs-Rails: Left Entrance Stairs-Number of Steps: 5 Home Layout: One level Home Equipment: Crutches      Prior Function Level of Independence: Independent  Comments: Hydrographic surveyor, drives, works     Journalist, newspaper   Dominant Hand: Right    Extremity/Trunk Assessment   Upper Extremity Assessment Upper Extremity Assessment:  Defer to OT evaluation    Lower Extremity Assessment Lower Extremity Assessment: Overall WFL for tasks assessed;LLE deficits/detail LLE Deficits / Details: LLE grossly 4+/5 except left knee 3+5 LLE: Unable to fully assess due to pain LLE Sensation: WNL LLE Coordination: WNL    Cervical / Trunk Assessment Cervical / Trunk Assessment: Normal  Communication   Communication: No difficulties  Cognition Arousal/Alertness: Awake/alert Behavior During Therapy: WFL for tasks assessed/performed Overall Cognitive Status: Within Functional Limits for tasks assessed                                        General Comments      Exercises     Assessment/Plan    PT Assessment All further PT needs can be met in the next venue of care  PT Problem List Decreased strength;Decreased activity tolerance;Decreased mobility;Pain       PT Treatment Interventions      PT Goals (Current goals can be found in the Care Plan section)  Acute Rehab PT Goals Patient Stated Goal: Return with less left knee pain PT Goal Formulation: With patient Time For Goal Achievement: 11/04/20 Potential to Achieve Goals: Good    Frequency     Barriers to discharge        Co-evaluation PT/OT/SLP Co-Evaluation/Treatment: Yes Reason for Co-Treatment: To address functional/ADL transfers PT goals addressed during session: Mobility/safety with mobility;Balance;Proper use of DME OT goals addressed during session: ADL's and self-care       AM-PAC PT "6 Clicks" Mobility  Outcome Measure Help needed turning from your back to your side while in a flat bed without using bedrails?: None Help needed moving from lying on your back to sitting on the side of a flat bed without using bedrails?: None Help needed moving to and from a bed to a chair (including a wheelchair)?: None Help needed standing up from a chair using your arms (e.g., wheelchair or bedside chair)?: None Help needed to walk in hospital  room?: A Little Help needed climbing 3-5 steps with a railing? : A Little 6 Click Score: 22    End of Session   Activity Tolerance: Patient tolerated treatment well;Patient limited by pain Patient left: in bed;with call bell/phone within reach;with family/visitor present Nurse Communication: Mobility status PT Visit Diagnosis: Unsteadiness on feet (R26.81);Other abnormalities of gait and mobility (R26.89);Muscle weakness (generalized) (M62.81)    Time: 4481-8563 PT Time Calculation (min) (ACUTE ONLY): 22 min   Charges:   PT Evaluation $PT Eval Moderate Complexity: 1 Mod PT Treatments $Therapeutic Activity: 8-22 mins        12:20 PM, 11/04/20 Lonell Grandchild, MPT Physical Therapist with [facility] [phone] office [phone] mobile phone

## 2021-04-01 DIAGNOSIS — R7303 Prediabetes: Secondary | ICD-10-CM | POA: Diagnosis not present

## 2021-04-01 DIAGNOSIS — I10 Essential (primary) hypertension: Secondary | ICD-10-CM | POA: Diagnosis not present

## 2021-04-01 DIAGNOSIS — E785 Hyperlipidemia, unspecified: Secondary | ICD-10-CM | POA: Diagnosis not present

## 2021-04-01 DIAGNOSIS — M109 Gout, unspecified: Secondary | ICD-10-CM | POA: Diagnosis not present

## 2021-04-01 DIAGNOSIS — E139 Other specified diabetes mellitus without complications: Secondary | ICD-10-CM | POA: Diagnosis not present

## 2021-04-01 DIAGNOSIS — L600 Ingrowing nail: Secondary | ICD-10-CM | POA: Diagnosis not present

## 2021-08-05 DIAGNOSIS — R7303 Prediabetes: Secondary | ICD-10-CM | POA: Diagnosis not present

## 2021-08-05 DIAGNOSIS — Z13220 Encounter for screening for lipoid disorders: Secondary | ICD-10-CM | POA: Diagnosis not present

## 2021-08-05 DIAGNOSIS — I10 Essential (primary) hypertension: Secondary | ICD-10-CM | POA: Diagnosis not present

## 2022-01-26 DIAGNOSIS — E1169 Type 2 diabetes mellitus with other specified complication: Secondary | ICD-10-CM | POA: Diagnosis not present

## 2022-01-26 DIAGNOSIS — E785 Hyperlipidemia, unspecified: Secondary | ICD-10-CM | POA: Diagnosis not present

## 2022-01-26 DIAGNOSIS — I10 Essential (primary) hypertension: Secondary | ICD-10-CM | POA: Diagnosis not present

## 2022-08-07 DIAGNOSIS — I10 Essential (primary) hypertension: Secondary | ICD-10-CM | POA: Diagnosis not present

## 2022-08-07 DIAGNOSIS — E1169 Type 2 diabetes mellitus with other specified complication: Secondary | ICD-10-CM | POA: Diagnosis not present

## 2022-08-07 DIAGNOSIS — E785 Hyperlipidemia, unspecified: Secondary | ICD-10-CM | POA: Diagnosis not present

## 2022-12-15 ENCOUNTER — Encounter (HOSPITAL_COMMUNITY): Payer: Self-pay

## 2022-12-15 ENCOUNTER — Emergency Department (HOSPITAL_COMMUNITY): Payer: BC Managed Care – PPO

## 2022-12-15 ENCOUNTER — Emergency Department (HOSPITAL_COMMUNITY)
Admission: EM | Admit: 2022-12-15 | Discharge: 2022-12-15 | Disposition: A | Discharge status: Home / Self Care | Payer: BC Managed Care – PPO | Attending: Emergency Medicine | Admitting: Emergency Medicine

## 2022-12-15 DIAGNOSIS — R109 Unspecified abdominal pain: Secondary | ICD-10-CM | POA: Diagnosis not present

## 2022-12-15 DIAGNOSIS — R160 Hepatomegaly, not elsewhere classified: Secondary | ICD-10-CM | POA: Diagnosis not present

## 2022-12-15 DIAGNOSIS — I10 Essential (primary) hypertension: Secondary | ICD-10-CM | POA: Diagnosis not present

## 2022-12-15 DIAGNOSIS — K4090 Unilateral inguinal hernia, without obstruction or gangrene, not specified as recurrent: Secondary | ICD-10-CM | POA: Diagnosis not present

## 2022-12-15 DIAGNOSIS — Z79899 Other long term (current) drug therapy: Secondary | ICD-10-CM | POA: Insufficient documentation

## 2022-12-15 DIAGNOSIS — N200 Calculus of kidney: Secondary | ICD-10-CM | POA: Insufficient documentation

## 2022-12-15 DIAGNOSIS — R1031 Right lower quadrant pain: Secondary | ICD-10-CM | POA: Diagnosis not present

## 2022-12-15 DIAGNOSIS — Z7984 Long term (current) use of oral hypoglycemic drugs: Secondary | ICD-10-CM | POA: Insufficient documentation

## 2022-12-15 DIAGNOSIS — E119 Type 2 diabetes mellitus without complications: Secondary | ICD-10-CM | POA: Insufficient documentation

## 2022-12-15 DIAGNOSIS — N132 Hydronephrosis with renal and ureteral calculous obstruction: Secondary | ICD-10-CM | POA: Diagnosis not present

## 2022-12-15 LAB — URINALYSIS, ROUTINE W REFLEX MICROSCOPIC
Bilirubin Urine: NEGATIVE
Glucose, UA: NEGATIVE mg/dL
Hgb urine dipstick: NEGATIVE
Ketones, ur: NEGATIVE mg/dL
Leukocytes,Ua: NEGATIVE
Nitrite: NEGATIVE
Protein, ur: NEGATIVE mg/dL
Specific Gravity, Urine: 1.012 (ref 1.005–1.030)
pH: 6 (ref 5.0–8.0)

## 2022-12-15 LAB — CBC WITH DIFFERENTIAL/PLATELET
Abs Immature Granulocytes: 0.07 10*3/uL (ref 0.00–0.07)
Basophils Absolute: 0.1 10*3/uL (ref 0.0–0.1)
Basophils Relative: 1 %
Eosinophils Absolute: 0.4 10*3/uL (ref 0.0–0.5)
Eosinophils Relative: 3 %
HCT: 45.9 % (ref 39.0–52.0)
Hemoglobin: 15.4 g/dL (ref 13.0–17.0)
Immature Granulocytes: 1 %
Lymphocytes Relative: 19 %
Lymphs Abs: 2.1 10*3/uL (ref 0.7–4.0)
MCH: 30.7 pg (ref 26.0–34.0)
MCHC: 33.6 g/dL (ref 30.0–36.0)
MCV: 91.4 fL (ref 80.0–100.0)
Monocytes Absolute: 0.7 10*3/uL (ref 0.1–1.0)
Monocytes Relative: 6 %
Neutro Abs: 7.9 10*3/uL — ABNORMAL HIGH (ref 1.7–7.7)
Neutrophils Relative %: 70 %
Platelets: 352 10*3/uL (ref 150–400)
RBC: 5.02 MIL/uL (ref 4.22–5.81)
RDW: 13.4 % (ref 11.5–15.5)
WBC: 11.2 10*3/uL — ABNORMAL HIGH (ref 4.0–10.5)
nRBC: 0 % (ref 0.0–0.2)

## 2022-12-15 LAB — BASIC METABOLIC PANEL
Anion gap: 9 (ref 5–15)
BUN: 14 mg/dL (ref 6–20)
CO2: 26 mmol/L (ref 22–32)
Calcium: 9.5 mg/dL (ref 8.9–10.3)
Chloride: 104 mmol/L (ref 98–111)
Creatinine, Ser: 1.35 mg/dL — ABNORMAL HIGH (ref 0.61–1.24)
GFR, Estimated: >60 mL/min (ref >60)
Glucose, Bld: 148 mg/dL — ABNORMAL HIGH (ref 70–99)
Potassium: 3.9 mmol/L (ref 3.5–5.1)
Sodium: 139 mmol/L (ref 135–145)

## 2022-12-15 MED ORDER — OXYCODONE HCL 5 MG PO TABS: 5.0000 mg | ORAL_TABLET | ORAL | 0 refills | Status: AC | PRN

## 2022-12-15 MED ORDER — LACTATED RINGERS IV BOLUS: 1000.0000 mL | Freq: Once | INTRAVENOUS | Status: DC

## 2022-12-15 MED ORDER — ONDANSETRON HCL 4 MG/2ML IJ SOLN
4.0000 mg | Freq: Once | INTRAMUSCULAR | Status: AC
  Administered 2022-12-15: 4 mg via INTRAVENOUS
  Filled 2022-12-15: qty 2

## 2022-12-15 MED ORDER — MORPHINE SULFATE (PF) 4 MG/ML IV SOLN
4.0000 mg | Freq: Once | INTRAVENOUS | Status: AC
  Administered 2022-12-15: 4 mg via INTRAVENOUS
  Filled 2022-12-15: qty 1

## 2022-12-15 MED ORDER — HYDROMORPHONE HCL 1 MG/ML IJ SOLN
1.0000 mg | Freq: Once | INTRAMUSCULAR | Status: AC
  Administered 2022-12-15: 1 mg via INTRAVENOUS
  Filled 2022-12-15: qty 1

## 2022-12-15 MED ORDER — TAMSULOSIN HCL 0.4 MG PO CAPS: 0.4000 mg | ORAL_CAPSULE | Freq: Every day | ORAL | 0 refills | Status: AC

## 2022-12-15 MED ORDER — KETOROLAC TROMETHAMINE 15 MG/ML IJ SOLN
15.0000 mg | Freq: Once | INTRAMUSCULAR | Status: AC
  Administered 2022-12-15: 15 mg via INTRAVENOUS
  Filled 2022-12-15: qty 1

## 2022-12-15 MED ORDER — SODIUM CHLORIDE 0.9 % IV BOLUS
1000.0000 mL | Freq: Once | INTRAVENOUS | Status: AC
  Administered 2022-12-15: 1000 mL via INTRAVENOUS

## 2022-12-15 NOTE — ED Provider Notes (Signed)
[facility] AT [facility] Provider Note   CSN: [id number] Arrival date & time: 12/15/22  0530     History  Chief Complaint  Patient presents with   Abdominal Pain    SCOTLAND CARABETTA is a 42 y.o. male.  HPI     This a 42 year old male who presents with right lower quadrant pain.  Patient reports acute onset of right lower quadrant pain approximately 1 hour prior to arrival.  He states the pain is in his right lower abdomen and radiates slightly backwards.  Never had pain like this before.  Denies hematuria or dysuria.  No history of kidney stones.  Denies fever.  Ports nausea without vomiting.  Home Medications Prior to Admission medications   Medication Sig Start Date End Date Taking? Authorizing Provider  atorvastatin (LIPITOR) 10 MG tablet Take 1 tablet by mouth daily. 09/13/20   [provider]  HYDROcodone-acetaminophen (NORCO) 5-325 MG tablet Take 1 tablet by mouth every 6 (six) hours as needed for moderate pain. 11/04/20   Catarina Hartshorn, MD  loratadine (CLARITIN) 10 MG tablet Take 10 mg by mouth daily.    [provider]  metFORMIN (GLUCOPHAGE-XR) 500 MG 24 hr tablet Take 500 mg by mouth daily. 09/13/20   [provider]  metoprolol succinate (TOPROL-XL) 50 MG 24 hr tablet Take 50 mg by mouth daily. 09/13/20   [provider]  RYBELSUS 7 MG TABS Take 1 tablet by mouth daily. 10/03/20   [provider]  sertraline (ZOLOFT) 25 MG tablet 100 mg.  05/28/17   [provider]      Allergies    Patient has no known allergies.    Review of Systems   Review of Systems  Constitutional:  Negative for fever.  Respiratory:  Negative for shortness of breath.   Cardiovascular:  Negative for chest pain.  Gastrointestinal:  Positive for abdominal pain and nausea. Negative for diarrhea.  All other systems reviewed and are negative.   Physical Exam Updated Vital Signs Ht 1.803 m (5\' 11" )   Wt 124.3 kg    BMI 38.22 kg/m  Physical Exam Vitals and nursing note reviewed.  Constitutional:      Appearance: He is well-developed. He is obese.     Comments: Uncomfortable appearing but nontoxic  HENT:     Head: Normocephalic and atraumatic.  Eyes:     Pupils: Pupils are equal, round, and reactive to light.  Cardiovascular:     Rate and Rhythm: Normal rate and regular rhythm.     Heart sounds: Normal heart sounds. No murmur heard. Pulmonary:     Effort: Pulmonary effort is normal. No respiratory distress.     Breath sounds: Normal breath sounds. No wheezing.  Abdominal:     General: Bowel sounds are normal.     Palpations: Abdomen is soft.     Tenderness: There is abdominal tenderness in the right lower quadrant. There is no guarding or rebound.  Musculoskeletal:     Cervical back: Neck supple.  Lymphadenopathy:     Cervical: No cervical adenopathy.  Skin:    General: Skin is warm and dry.  Neurological:     Mental Status: He is alert and oriented to person, place, and time.  Psychiatric:        Mood and Affect: Mood normal.     ED Results / Procedures / Treatments   Labs (all labs ordered are listed, but only abnormal results are displayed) Labs Reviewed  CBC WITH DIFFERENTIAL/PLATELET  BASIC METABOLIC PANEL  URINALYSIS, ROUTINE W REFLEX MICROSCOPIC    EKG None  Radiology No results found.  Procedures Procedures    Medications Ordered in ED Medications  morphine (PF) 4 MG/ML injection 4 mg (has no administration in time range)  ondansetron (ZOFRAN) injection 4 mg (has no administration in time range)  sodium chloride 0.9 % bolus 1,000 mL (has no administration in time range)    ED Course/ Medical Decision Making/ A&P Clinical Course as of 12/16/22 0433  Tue Dec 15, 2022  0700 Assumed care from Dr Wilkie Aye.  42 year old male who presented to the emergency department with abdominal pain.  Has CT stone that shows a CT stone study with 3mm UPJ stone. Needs pain control  and dc.  [RP]  0929 Creatinine(!): 1.35 At baseline [RP]  1042 Patient's pain has resolved.  Urinalysis does not show any evidence of urinary tract infection.  Patient discharged home with Flomax, urine strainer, and instructions to take Tylenol ibuprofen for his pain and oxycodone for breakthrough pain.  Will him follow-up with urology in 1 week. [RP]    Clinical Course User Index [RP] Rondel Baton, MD                             Medical Decision Making Amount and/or Complexity of Data Reviewed Labs: ordered. Decision-making details documented in ED Course. Radiology: ordered.  Risk Prescription drug management.   This patient presents to the ED for concern of abdominal pain, this involves an extensive number of treatment options, and is a complaint that carries with it a high risk of complications and morbidity.  I considered the following differential and admission for this acute, potentially life threatening condition.  The differential diagnosis includes kidney stone, appendicitis, cholecystitis, urinary tract infection  MDM:    This is a 42 year old male who presents with acute onset of right lower quadrant pain.  He is nontoxic.  Vital signs are largely reassuring.  Given description of pain, highly suspect kidney stone.  CT stone study obtained and shows a 3 mm obstructing stone.  Unfortunately, lab work and interventions were delayed.  I ordered a liter of fluids and pain and nausea medication.  Initial lab work is largely reassuring.  Patient needs urinalysis and recheck of symptoms.  Discussed with him follow-up with urology and supportive measures at home.  (Labs, imaging, consults)  Labs: I Ordered, and personally interpreted labs.  The pertinent results include: CBC, BMP, urinalysis  Imaging Studies ordered: I ordered imaging studies including CT stone study I independently visualized and interpreted imaging. I agree with the radiologist  interpretation  Additional history obtained from wife at bedside.  External records from outside source obtained and reviewed including prior evaluations  Cardiac Monitoring: The patient was maintained on a cardiac monitor.  If on the cardiac monitor, I personally viewed and interpreted the cardiac monitored which showed an underlying rhythm of: Sinus rhythm  Reevaluation: After the interventions noted above, I reevaluated the patient and found that they have :improved  Social Determinants of Health:  lives independently  Disposition: Signed out to oncoming provider  Co morbidities that complicate the patient evaluation  Past Medical History:  Diagnosis Date   Bell's palsy    Diabetes mellitus without complication (HCC)    Gout    Hypercholesteremia    Hypertension      Medicines Meds ordered this encounter  Medications   morphine (  PF) 4 MG/ML injection 4 mg   ondansetron (ZOFRAN) injection 4 mg   sodium chloride 0.9 % bolus 1,000 mL   ketorolac (TORADOL) 15 MG/ML injection 15 mg   HYDROmorphone (DILAUDID) injection 1 mg   DISCONTD: lactated ringers bolus 1,000 mL   oxyCODONE (ROXICODONE) 5 MG immediate release tablet    Sig: Take 1 tablet (5 mg total) by mouth every 4 (four) hours as needed for severe pain.    Dispense:  15 tablet    Refill:  0   tamsulosin (FLOMAX) 0.4 MG CAPS capsule    Sig: Take 1 capsule (0.4 mg total) by mouth daily after breakfast.    Dispense:  30 capsule    Refill:  0    I have reviewed the patients home medicines and have made adjustments as needed  Problem List / ED Course: Problem List Items Addressed This Visit   None Visit Diagnoses     Kidney stone    -  Primary   Relevant Medications   morphine (PF) 4 MG/ML injection 4 mg (Completed)   HYDROmorphone (DILAUDID) injection 1 mg (Completed)   oxyCODONE (ROXICODONE) 5 MG immediate release tablet                   Final Clinical Impression(s) / ED Diagnoses Final  diagnoses:  None    Rx / DC Orders ED Discharge Orders     None         Shon Baton, MD 12/16/22 [phone]

## 2022-12-15 NOTE — ED Provider Notes (Signed)
  Physical Exam  BP (!) 160/96 (BP Location: Right Arm)   Pulse 64   Temp 97.6 F (36.4 C) (Oral)   Resp 17   Ht 5\' 11"  (1.803 m)   Wt 124.3 kg   SpO2 96%   BMI 38.22 kg/m   Physical Exam  Procedures  Procedures  ED Course / MDM   Clinical Course as of 12/15/22 1043  Tue Dec 15, 2022  0700 Assumed care from Dr Wilkie Aye.  42 year old male who presented to the emergency department with abdominal pain.  Has CT stone that shows a CT stone study with 3mm UPJ stone. Needs pain control and dc.  [RP]  0929 Creatinine(!): 1.35 At baseline [RP]  1042 Patient's pain has resolved.  Urinalysis does not show any evidence of urinary tract infection.  Patient discharged home with Flomax, urine strainer, and instructions to take Tylenol ibuprofen for his pain and oxycodone for breakthrough pain.  Will him follow-up with urology in 1 week. [RP]    Clinical Course User Index [RP] Rondel Baton, MD   Medical Decision Making Amount and/or Complexity of Data Reviewed Labs: ordered. Decision-making details documented in ED Course. Radiology: ordered.  Risk Prescription drug management.      Rondel Baton, MD 12/15/22 [phone]

## 2022-12-15 NOTE — ED Triage Notes (Signed)
Pt states he woke up out of sleep with RLQ intense pain. Sudden onset. W/O N/V.

## 2022-12-15 NOTE — Discharge Instructions (Addendum)
You were seen for your kidney stone in the emergency department.    At home, please take Tylenol and ibuprofen for your pain. You may also take the oxycodone we have prescribed you for any breakthrough pain that may have.  Do not take this before driving or operating heavy machinery.  Do not take this medication with alcohol.  Use the flowmax we have give you every day until the kidney stone passes. Please also strain your urine to collect the kidney stone. Store it in a container and take it to your urologist for analysis.   Follow-up with urology in a week to discuss your symptoms.   Return immediately to the emergency department if you experience any of the following: fever, unbearable pain, urinary retention, or any other concerning symptoms.    Thank you for visiting our Emergency Department. It was a pleasure taking care of you today.   

## 2023-02-09 DIAGNOSIS — E1169 Type 2 diabetes mellitus with other specified complication: Secondary | ICD-10-CM | POA: Diagnosis not present

## 2023-02-09 DIAGNOSIS — I10 Essential (primary) hypertension: Secondary | ICD-10-CM | POA: Diagnosis not present

## 2023-02-09 DIAGNOSIS — N200 Calculus of kidney: Secondary | ICD-10-CM | POA: Diagnosis not present

## 2023-02-09 DIAGNOSIS — E785 Hyperlipidemia, unspecified: Secondary | ICD-10-CM | POA: Diagnosis not present

## 2023-12-27 ENCOUNTER — Ambulatory Visit (HOSPITAL_COMMUNITY)
Admission: RE | Admit: 2023-12-27 | Discharge: 2023-12-27 | Disposition: A | Discharge status: Home / Self Care | Source: Ambulatory Visit | Attending: Nurse Practitioner | Admitting: Nurse Practitioner

## 2023-12-27 ENCOUNTER — Ambulatory Visit
Admission: EM | Admit: 2023-12-27 | Discharge: 2023-12-27 | Disposition: A | Discharge status: Home / Self Care | Attending: Nurse Practitioner | Admitting: Nurse Practitioner

## 2023-12-27 DIAGNOSIS — M1712 Unilateral primary osteoarthritis, left knee: Secondary | ICD-10-CM | POA: Insufficient documentation

## 2023-12-27 DIAGNOSIS — M25562 Pain in left knee: Secondary | ICD-10-CM | POA: Insufficient documentation

## 2023-12-27 DIAGNOSIS — M25462 Effusion, left knee: Secondary | ICD-10-CM

## 2023-12-27 MED ORDER — MELOXICAM 7.5 MG PO TABS: 7.5000 mg | ORAL_TABLET | Freq: Every day | ORAL | 0 refills | Status: AC

## 2023-12-27 MED ORDER — KETOROLAC TROMETHAMINE 30 MG/ML IJ SOLN
30.0000 mg | Freq: Once | INTRAMUSCULAR | Status: AC
  Administered 2023-12-27: 30 mg via INTRAMUSCULAR

## 2023-12-27 NOTE — ED Triage Notes (Signed)
 Pt reports he has left knee pain x 3 days States he felt a buckle.

## 2023-12-27 NOTE — ED Provider Notes (Signed)
 [facility]    CSN: [id number] Arrival date & time: 12/27/23  0932      History   Chief Complaint No chief complaint on file.   HPI Gavin Wilson is a 43 y.o. male.   The history is provided by the patient.   Patient presents with a 3-day history of left knee pain.  Patient states over the past 24 hours, he began experiencing difficulty bearing weight.  He complains of swelling and decreased range of motion in the knee as well.  He denies any obvious known injury, trauma, lower extremity weakness, numbness, tingling, or radiation of pain.  Patient states that he did have prior surgeries to his knee as a child, also states that he injured his knee several years ago back in 2017.  Patient reports he has a prior history of gout, but states that my gout has never bothered my knee.  Patient states he has been using ice to the left knee.  States that he had to use a crutch today in order to ambulate.  Past Medical History:  Diagnosis Date   Bell's palsy    Diabetes mellitus without complication (HCC)    Gout    Hypercholesteremia    Hypertension     Patient Active Problem List   Diagnosis Date Noted   AKI (acute kidney injury) (HCC) 11/04/2020   Left knee pain 11/03/2020   Abdominal pain 11/03/2020   Nausea & vomiting 11/03/2020   Hypokalemia 11/03/2020   Hyperglycemia due to diabetes mellitus (HCC) 11/03/2020   Hepatic steatosis 11/03/2020   Obesity, Class III, BMI 40-49.9 (morbid obesity) 11/03/2020   Prolonged QT interval 11/03/2020   Essential hypertension 11/03/2020   Hyperlipidemia 11/03/2020   Gout 11/03/2020   Dehydration 11/03/2020   Thrombocytosis 11/03/2020   Acute gastroenteritis 11/03/2020   Acute diarrhea 11/02/2020   OSA (obstructive sleep apnea) 06/29/2017   Obesity (BMI 35.0-39.9 without comorbidity) 06/29/2017    Past Surgical History:  Procedure Laterality Date   KNEE SURGERY         Home Medications    Prior to Admission  medications   Medication Sig Start Date End Date Taking? Authorizing Provider  atorvastatin (LIPITOR) 10 MG tablet Take 1 tablet by mouth daily. 09/13/20   [provider]  HYDROcodone -acetaminophen  (NORCO) 5-325 MG tablet Take 1 tablet by mouth every 6 (six) hours as needed for moderate pain. 11/04/20   Evonnie Lenis, MD  loratadine (CLARITIN) 10 MG tablet Take 10 mg by mouth daily.    [provider]  metFORMIN (GLUCOPHAGE-XR) 500 MG 24 hr tablet Take 500 mg by mouth daily. 09/13/20   [provider]  metoprolol succinate (TOPROL-XL) 50 MG 24 hr tablet Take 50 mg by mouth daily. 09/13/20   [provider]  oxyCODONE  (ROXICODONE ) 5 MG immediate release tablet Take 1 tablet (5 mg total) by mouth every 4 (four) hours as needed for severe pain. 12/15/22   Yolande Lamar BROCKS, MD  RYBELSUS 7 MG TABS Take 1 tablet by mouth daily. 10/03/20   [provider]  sertraline (ZOLOFT) 25 MG tablet 100 mg.  05/28/17   [provider]  tamsulosin  (FLOMAX ) 0.4 MG CAPS capsule Take 1 capsule (0.4 mg total) by mouth daily after breakfast. 12/15/22   Yolande Lamar BROCKS, MD    Family History History reviewed. No pertinent family history.  Social History Social History   Tobacco Use   Smoking status: Never   Smokeless tobacco: Never  Vaping Use  Vaping status: Never Used  Substance Use Topics   Alcohol use: No   Drug use: No     Allergies   Patient has no known allergies.   Review of Systems Review of Systems Per HPI  Physical Exam Triage Vital Signs ED Triage Vitals  Encounter Vitals Group     BP 12/27/23 0955 (!) 143/82     Girls Systolic BP Percentile --      Girls Diastolic BP Percentile --      Boys Systolic BP Percentile --      Boys Diastolic BP Percentile --      Pulse Rate 12/27/23 0955 70     Resp 12/27/23 0955 18     Temp 12/27/23 0955 98.7 F (37.1 C)     Temp Source 12/27/23 0955 Oral     SpO2 12/27/23 0955 94 %     Weight --       Height --      Head Circumference --      Peak Flow --      Pain Score 12/27/23 0953 7     Pain Loc --      Pain Education --      Exclude from Growth Chart --    No data found.  Updated Vital Signs BP (!) 143/82 (BP Location: Right Arm)   Pulse 70   Temp 98.7 F (37.1 C) (Oral)   Resp 18   SpO2 94%   Visual Acuity Right Eye Distance:   Left Eye Distance:   Bilateral Distance:    Right Eye Near:   Left Eye Near:    Bilateral Near:     Physical Exam Vitals and nursing note reviewed.  Constitutional:      General: He is not in acute distress.    Appearance: Normal appearance.  HENT:     Head: Normocephalic.  Eyes:     Extraocular Movements: Extraocular movements intact.     Pupils: Pupils are equal, round, and reactive to light.  Pulmonary:     Effort: Pulmonary effort is normal.  Musculoskeletal:     Left knee: Swelling and effusion present. Decreased range of motion. Tenderness present over the lateral joint line and LCL. Normal pulse.  Skin:    General: Skin is warm and dry.  Neurological:     General: No focal deficit present.     Mental Status: He is alert and oriented to person, place, and time.  Psychiatric:        Mood and Affect: Mood normal.        Behavior: Behavior normal.      UC Treatments / Results  Labs (all labs ordered are listed, but only abnormal results are displayed) Labs Reviewed - No data to display  EKG   Radiology No results found.  Procedures Procedures (including critical care time)  Medications Ordered in UC Medications - No data to display  Initial Impression / Assessment and Plan / UC Course  I have reviewed the triage vital signs and the nursing notes.  Pertinent labs & imaging results that were available during my care of the patient were reviewed by me and considered in my medical decision making (see chart for details).  X-ray of the left knee is pending.  On exam, patient with tenderness to the lateral aspect  of the left knee including the meniscus.  He he does present with a possible effusion of the left knee.  Hinged knee brace was applied to provide additional  compression and support.  Will start patient on meloxicam  to help with inflammation and swelling.  Toradol  30 mg IM administered for left knee pain, swelling and inflammation.  Supportive care recommendations were provided and discussed with the patient to include RICE therapy.  Discussed indications with patient regarding follow-up.  Patient was advised if symptoms fail to improve, it is recommended that he follow-up with orthopedics for further evaluation.  Patient was given information for [facility] and for [facility] [place].  Patient was in agreement with this plan of care and verbalizes understanding.  All questions were answered.  Patient stable for discharge.   Final Clinical Impressions(s) / UC Diagnoses   Final diagnoses:  None   Discharge Instructions   None    ED Prescriptions   None    PDMP not reviewed this encounter.   Gilmer Etta PARAS, NP 12/27/23 1047

## 2023-12-27 NOTE — Discharge Instructions (Signed)
 You were given an injection of Toradol  30 mg.  Do not take any additional NSAIDs such as ibuprofen, Aleve, Advil, Motrin, or naproxen today.  You may take over-the-counter Tylenol  for breakthrough pain or discomfort. RICE therapy, rest, ice, compression, and elevation.  Apply ice for pain or swelling, heat for spasm or stiffness.  Apply for 20 minutes, remove for 1 hour, repeat as needed. Wear the knee brace when you are engaged in prolonged or strenuous activity. Gentle range of motion exercises to help keep the joint mobile. As discussed, if symptoms fail to improve, it is recommended that you follow-up with orthopedics for further evaluation. Follow-up as needed.

## 2023-12-28 ENCOUNTER — Ambulatory Visit (HOSPITAL_COMMUNITY): Payer: Self-pay
